# Patient Record
Sex: Male | Born: 1955 | Race: White | Hispanic: No | Marital: Married | Smoking: Current every day smoker
Health system: Southern US, Community
[De-identification: ages and names within clinical notes are randomized; demographics above are authoritative.]

## PROBLEM LIST (undated history)

## (undated) DIAGNOSIS — F419 Anxiety disorder, unspecified: Secondary | ICD-10-CM

## (undated) DIAGNOSIS — F329 Major depressive disorder, single episode, unspecified: Secondary | ICD-10-CM

## (undated) DIAGNOSIS — F32A Depression, unspecified: Secondary | ICD-10-CM

## (undated) DIAGNOSIS — G959 Disease of spinal cord, unspecified: Secondary | ICD-10-CM

## (undated) DIAGNOSIS — R269 Unspecified abnormalities of gait and mobility: Secondary | ICD-10-CM

## (undated) HISTORY — DX: Depression, unspecified: F32.A

## (undated) HISTORY — PX: LAMINECTOMY: SHX219

## (undated) HISTORY — PX: TONSILLECTOMY: SUR1361

## (undated) HISTORY — DX: Anxiety disorder, unspecified: F41.9

---
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
## 2011-11-20 ENCOUNTER — Ambulatory Visit: Payer: Self-pay | Admitting: Internal Medicine

## 2011-11-20 DIAGNOSIS — D229 Melanocytic nevi, unspecified: Secondary | ICD-10-CM | POA: Insufficient documentation

## 2011-11-20 DIAGNOSIS — M62838 Other muscle spasm: Secondary | ICD-10-CM | POA: Insufficient documentation

## 2011-11-20 DIAGNOSIS — R61 Generalized hyperhidrosis: Secondary | ICD-10-CM | POA: Insufficient documentation

## 2011-12-06 DIAGNOSIS — F419 Anxiety disorder, unspecified: Secondary | ICD-10-CM | POA: Insufficient documentation

## 2011-12-06 DIAGNOSIS — M62838 Other muscle spasm: Secondary | ICD-10-CM | POA: Insufficient documentation

## 2011-12-07 ENCOUNTER — Encounter: Payer: Self-pay | Admitting: Internal Medicine

## 2011-12-11 ENCOUNTER — Ambulatory Visit: Payer: Self-pay | Admitting: Internal Medicine

## 2011-12-21 DIAGNOSIS — Z720 Tobacco use: Secondary | ICD-10-CM | POA: Insufficient documentation

## 2011-12-27 ENCOUNTER — Encounter: Payer: Self-pay | Admitting: *Deleted

## 2011-12-27 ENCOUNTER — Telehealth: Payer: Self-pay | Admitting: *Deleted

## 2011-12-27 ENCOUNTER — Ambulatory Visit: Payer: Self-pay | Admitting: Oncology

## 2011-12-27 LAB — CBC CANCER CENTER
Eosinophil %: 4.3 %
HGB: 17.8 g/dL (ref 13.0–18.0)
Lymphocyte %: 24.4 %
MCH: 34.1 pg — ABNORMAL HIGH (ref 26.0–34.0)
MCHC: 34.7 g/dL (ref 32.0–36.0)
MCV: 98 fL (ref 80–100)
Monocyte #: 0.5 x10 3/mm (ref 0.2–1.0)
Monocyte %: 6.7 %
Neutrophil %: 63.6 %
Platelet: 159 x10 3/mm (ref 150–440)
RBC: 5.22 10*6/uL (ref 4.40–5.90)
RDW: 12.8 % (ref 11.5–14.5)
WBC: 7.8 x10 3/mm (ref 3.8–10.6)

## 2011-12-27 LAB — COMPREHENSIVE METABOLIC PANEL
Albumin: 4.1 g/dL (ref 3.4–5.0)
Anion Gap: 7 (ref 7–16)
BUN: 10 mg/dL (ref 7–18)
Calcium, Total: 9.3 mg/dL (ref 8.5–10.1)
Chloride: 99 mmol/L (ref 98–107)
Co2: 32 mmol/L (ref 21–32)
Creatinine: 1.1 mg/dL (ref 0.60–1.30)
EGFR (African American): >60
Osmolality: 274 (ref 275–301)
Potassium: 4.1 mmol/L (ref 3.5–5.1)
SGOT(AST): 16 U/L (ref 15–37)
Sodium: 138 mmol/L (ref 136–145)

## 2011-12-27 NOTE — Telephone Encounter (Signed)
Pt. Did not come for p.v.he does not answer phone x2 attempts and does not have message machine.Procedure cx. And letter sent to pt. to r/s.

## 2012-01-03 ENCOUNTER — Encounter: Payer: Self-pay | Admitting: Internal Medicine

## 2012-01-08 ENCOUNTER — Ambulatory Visit: Payer: Self-pay | Admitting: Oncology

## 2012-02-08 ENCOUNTER — Ambulatory Visit: Payer: Self-pay | Admitting: Oncology

## 2016-06-09 ENCOUNTER — Other Ambulatory Visit: Payer: Self-pay | Admitting: Acute Care

## 2016-06-09 DIAGNOSIS — F17210 Nicotine dependence, cigarettes, uncomplicated: Secondary | ICD-10-CM

## 2016-06-23 ENCOUNTER — Ambulatory Visit (INDEPENDENT_AMBULATORY_CARE_PROVIDER_SITE_OTHER): Payer: Managed Care, Other (non HMO) | Admitting: Acute Care

## 2016-06-23 ENCOUNTER — Encounter: Payer: Self-pay | Admitting: Acute Care

## 2016-06-23 ENCOUNTER — Inpatient Hospital Stay: Admission: RE | Admit: 2016-06-23 | Payer: Self-pay | Source: Ambulatory Visit

## 2016-06-23 DIAGNOSIS — F17210 Nicotine dependence, cigarettes, uncomplicated: Secondary | ICD-10-CM

## 2016-06-23 NOTE — Progress Notes (Signed)
Shared Decision Making Visit Lung Cancer Screening Program [phone]([id number])   Eligibility:  Age 60 y.o.  Pack Years Smoking History Calculation 35 (# packs/per year x # years smoked)  Recent History of coughing up blood  no  Unexplained weight loss? no ( >Than 15 pounds within the last 6 months )  Prior History Lung / other cancer no (Diagnosis within the last 5 years already requiring surveillance chest CT Scans).  Smoking Status Current Smoker  Former Smokers: Years since quit:NA  Quit Date: NA  Visit Components:  Discussion included one or more decision making aids. yes  Discussion included risk/benefits of screening. yes  Discussion included potential follow up diagnostic testing for abnormal scans. yes  Discussion included meaning and risk of over diagnosis. yes  Discussion included meaning and risk of False Positives. yes  Discussion included meaning of total radiation exposure. yes  Counseling Included:  Importance of adherence to annual lung cancer LDCT screening. yes  Impact of comorbidities on ability to participate in the program. yes  Ability and willingness to under diagnostic treatment. yes  Smoking Cessation Counseling:  Current Smokers:   Discussed importance of smoking cessation. yes  Information about tobacco cessation classes and interventions provided to patient. yes  Patient provided with "ticket" for LDCT Scan. yes  Symptomatic Patient. no  Counseling  Diagnosis Code: Tobacco Use Z72.0  Asymptomatic Patient yes  Counseling (Intermediate counseling: > three minutes counseling) [id number]  Former Smokers:   Discussed the importance of maintaining cigarette abstinence. yes  Diagnosis Code: Personal History of Nicotine Dependence. X91.478Z87.891  Information about tobacco cessation classes and interventions provided to patient. Yes  Patient provided with "ticket" for LDCT Scan. yes  Written Order for Lung Cancer Screening with LDCT placed in  Epic. Yes (CT Chest Lung Cancer Screening Low Dose W/O CM) [id number] Z12.2-Screening of respiratory organs Z87.891-Personal history of nicotine dependence   I have spent 20  minutes of face to face time with Brandon Heath discussing the risks and benefits of lung cancer screening. We viewed a power point together that explained in detail the above noted topics. We paused at intervals to allow for questions to be asked and answered to ensure understanding.We discussed that the single most powerful action that he can take to decrease his risk of developing lung cancer is to quit smoking. We discussed whether or not he is ready to commit to setting a quit date. We discussed options for tools to aid in quitting smoking including nicotine replacement therapy, non-nicotine medications, support groups, Quit Smart classes, and behavior modification. We discussed that often times setting smaller, more achievable goals, such as eliminating 1 cigarette a day for a week and then 2 cigarettes a day for a week can be helpful in slowly decreasing the number of cigarettes smoked. ( Reducing to quit).This allows for a sense of accomplishment as well as providing a clinical benefit. I gave Brandon Heath  the " Be Stronger Than Your Excuses" card with contact information for community resources, classes, free nicotine replacement therapy, and access to mobile apps, text messaging, and on-line smoking cessation help. I have also given him  my card and contact information in the event he needs to contact me. We discussed the time and location of the scan, and that either Brandon Heath, CMA, or I will call with the results within 24-48 hours of receiving them. I have provided him  with a copy of the power point we viewed  as a resource in the event  they need reinforcement of the concepts we discussed today in the office. The patient verbalized understanding of all of  the above and had no further questions upon leaving the office. They have my  contact information in the event they have any further questions.  Bevelyn NgoSarah F Rondrick Barreira, NP 06/23/2016

## 2016-06-30 ENCOUNTER — Telehealth: Payer: Self-pay | Admitting: Acute Care

## 2016-06-30 ENCOUNTER — Ambulatory Visit (INDEPENDENT_AMBULATORY_CARE_PROVIDER_SITE_OTHER)
Admission: RE | Admit: 2016-06-30 | Discharge: 2016-06-30 | Disposition: A | Discharge status: Home / Self Care | Payer: Managed Care, Other (non HMO) | Source: Ambulatory Visit | Attending: Acute Care | Admitting: Acute Care

## 2016-06-30 DIAGNOSIS — F17210 Nicotine dependence, cigarettes, uncomplicated: Secondary | ICD-10-CM

## 2016-06-30 DIAGNOSIS — Z87891 Personal history of nicotine dependence: Secondary | ICD-10-CM

## 2016-06-30 NOTE — Telephone Encounter (Signed)
I have called Brandon Heath with the results of his low-dose screening CT. I explained that his scan was read as a lung RADS 1, indicating no nodules, or a negative study. Recommendation per radiology is for a repeat annual screening with low-dose chest CT in December 2018 which we will order and schedule in December 2018. He verbalized understanding of the above and had no further questions at completion of the call. I explained that we would fax a copy of this report to his primary care physician.

## 2017-07-18 ENCOUNTER — Encounter: Payer: Self-pay | Admitting: Acute Care

## 2018-04-19 ENCOUNTER — Other Ambulatory Visit: Payer: Self-pay | Admitting: Acute Care

## 2018-04-19 DIAGNOSIS — Z720 Tobacco use: Secondary | ICD-10-CM

## 2018-04-19 NOTE — Progress Notes (Signed)
Error

## 2018-06-05 ENCOUNTER — Ambulatory Visit (INDEPENDENT_AMBULATORY_CARE_PROVIDER_SITE_OTHER)
Admission: RE | Admit: 2018-06-05 | Discharge: 2018-06-05 | Disposition: A | Discharge status: Home / Self Care | Payer: Managed Care, Other (non HMO) | Source: Ambulatory Visit | Attending: Acute Care | Admitting: Acute Care

## 2018-06-05 DIAGNOSIS — Z720 Tobacco use: Secondary | ICD-10-CM

## 2018-06-24 ENCOUNTER — Encounter: Payer: Self-pay | Admitting: *Deleted

## 2018-06-24 ENCOUNTER — Other Ambulatory Visit: Payer: Self-pay | Admitting: Acute Care

## 2018-06-24 DIAGNOSIS — F17210 Nicotine dependence, cigarettes, uncomplicated: Principal | ICD-10-CM

## 2018-06-24 DIAGNOSIS — Z122 Encounter for screening for malignant neoplasm of respiratory organs: Secondary | ICD-10-CM

## 2019-02-26 DIAGNOSIS — Z125 Encounter for screening for malignant neoplasm of prostate: Secondary | ICD-10-CM | POA: Diagnosis not present

## 2019-02-26 DIAGNOSIS — Z0000 Encounter for general adult medical examination without abnormal findings: Secondary | ICD-10-CM | POA: Diagnosis not present

## 2019-03-04 DIAGNOSIS — Z0000 Encounter for general adult medical examination without abnormal findings: Secondary | ICD-10-CM | POA: Diagnosis not present

## 2019-03-04 DIAGNOSIS — Z23 Encounter for immunization: Secondary | ICD-10-CM | POA: Diagnosis not present

## 2019-03-04 DIAGNOSIS — E7800 Pure hypercholesterolemia, unspecified: Secondary | ICD-10-CM | POA: Diagnosis not present

## 2019-03-10 DIAGNOSIS — R399 Unspecified symptoms and signs involving the genitourinary system: Secondary | ICD-10-CM | POA: Diagnosis not present

## 2019-03-10 DIAGNOSIS — N390 Urinary tract infection, site not specified: Secondary | ICD-10-CM | POA: Diagnosis not present

## 2019-03-10 DIAGNOSIS — R319 Hematuria, unspecified: Secondary | ICD-10-CM | POA: Diagnosis not present

## 2019-04-08 DIAGNOSIS — Z1211 Encounter for screening for malignant neoplasm of colon: Secondary | ICD-10-CM | POA: Diagnosis not present

## 2019-04-08 DIAGNOSIS — Z86010 Personal history of colonic polyps: Secondary | ICD-10-CM | POA: Diagnosis not present

## 2019-04-09 DIAGNOSIS — D225 Melanocytic nevi of trunk: Secondary | ICD-10-CM | POA: Diagnosis not present

## 2019-04-09 DIAGNOSIS — D485 Neoplasm of uncertain behavior of skin: Secondary | ICD-10-CM | POA: Diagnosis not present

## 2019-04-09 DIAGNOSIS — L905 Scar conditions and fibrosis of skin: Secondary | ICD-10-CM | POA: Diagnosis not present

## 2019-04-16 ENCOUNTER — Encounter: Payer: Self-pay | Admitting: Neurology

## 2019-04-16 ENCOUNTER — Other Ambulatory Visit: Payer: Self-pay

## 2019-04-16 ENCOUNTER — Ambulatory Visit (INDEPENDENT_AMBULATORY_CARE_PROVIDER_SITE_OTHER): Payer: BC Managed Care – PPO | Admitting: Neurology

## 2019-04-16 DIAGNOSIS — G959 Disease of spinal cord, unspecified: Secondary | ICD-10-CM

## 2019-04-16 DIAGNOSIS — R269 Unspecified abnormalities of gait and mobility: Secondary | ICD-10-CM | POA: Diagnosis not present

## 2019-04-16 HISTORY — DX: Disease of spinal cord, unspecified: G95.9

## 2019-04-16 HISTORY — DX: Unspecified abnormalities of gait and mobility: R26.9

## 2019-04-16 MED ORDER — TIZANIDINE HCL 2 MG PO TABS: 2.0000 mg | ORAL_TABLET | Freq: Three times a day (TID) | ORAL | 3 refills | Status: DC

## 2019-04-16 NOTE — Progress Notes (Signed)
Reason for visit: Gait disorder  Referring physician: Dr. [name] is a 63 y.o. male  History of present illness:  Brandon Heath is a 63 year old right-handed white male with a history of a cervical myelopathy that was discovered 25 or 30 years ago requiring a cervical laminectomy.  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  He believes that there may have been some progression in his ability to ambulate over the last several years, he has not had recent falls but he is having increasing problems with catching his toe when he walks.  He feels more weak in the lower extremities and has some tightness in the hamstrings.  He denies any issues with the arms, but he does have some numbness in the hands bilaterally.  He denies any muscle cramps or difficulty controlling the bowels or the bladder.  He has been placed on baclofen in the past but he stopped the medication only after a few days, he does not recall why.  He is sent back to this office for an evaluation.  Past Medical History:  Diagnosis Date  . Anxiety   . Cervical myelopathy (HCC) 04/16/2019  . Depression   . Gait disorder 04/16/2019    Past Surgical History:  Procedure Laterality Date  . LAMINECTOMY     cervical   . TONSILLECTOMY      Family History  Problem Relation Age of Onset  . Heart disease Mother   . Rectal cancer Father     Social history:  reports that he has been smoking cigarettes. He has a 35.00 pack-year smoking history. He has never used smokeless tobacco. No history on file for alcohol and drug.  Medications:  Prior to Admission medications   Medication Sig Start Date End Date Taking? Authorizing Provider  ALPRAZolam Prudy Feeler) 0.25 MG tablet  04/09/19  Yes [provider]  EUTHYROX 25 MCG tablet  04/09/19  Yes [provider]     No Known Allergies  ROS:  Out of a complete 14 system  review of symptoms, the patient complains only of the following symptoms, and all other reviewed systems are negative.  Aching muscles Restless legs  Blood pressure 106/74, pulse 88, temperature 98 F (36.7 C), temperature source Temporal, height 5\' 7"  (1.702 m), weight 134 lb 5 oz (60.9 kg), SpO2 97 %.  Physical Exam  General: The patient is alert and cooperative at the time of the examination.  Eyes: Pupils are equal, round, and reactive to light. Discs are flat bilaterally.  Neck: The neck is supple, no carotid bruits are noted.  Respiratory: The respiratory examination is clear.  Cardiovascular: The cardiovascular examination reveals a regular rate and rhythm, no obvious murmurs or rubs are noted.  Skin: Extremities are without significant edema.  Neurologic Exam  Mental status: The patient is alert and oriented x 3 at the time of the examination. The patient has apparent normal recent and remote memory, with an apparently normal attention span and concentration ability.  Cranial nerves: Facial symmetry is present. There is good sensation of the face to pinprick and soft touch bilaterally. The strength of the facial muscles and the muscles to head turning and shoulder shrug are normal bilaterally. Speech is well enunciated, no aphasia or dysarthria is noted. Extraocular movements are full. Visual fields are full. The tongue is midline, and the patient has symmetric elevation of the soft  palate. No obvious hearing deficits are noted.  Motor: The motor testing reveals 5 over 5 strength of all 4 extremities, with exception of 4/5 strength with hip flexion bilaterally, left weaker than the right. Good symmetric motor tone is noted throughout.  Sensory: Sensory testing is intact to pinprick, soft touch, vibration sensation, and position sense on all 4 extremities. No evidence of extinction is noted.  Coordination: Cerebellar testing reveals mild dysmetria with finger-nose-finger  bilaterally, left greater than right, and some dysmetria with heel-to-shin bilaterally, left greater than right.  Gait and station: Gait is slightly unsteady, slightly wide-based, mildly diplegic.  Tandem gait is unsteady.  Romberg is negative.  Reflexes: Deep tendon reflexes are symmetric and are brisk bilaterally. Toes are upgoing on the left, neutral on the right.  Sustained ankle clonus is seen bilaterally.   Assessment/Plan:  1.  Cervical myelopathy  2.  Gait disorder  Theoretically, this patient should benefit from the use of medications for spasticity.  We will start tizanidine 2 mg 3 times daily.  If he believes that his ability to walk is worsening over time, he is to contact our office and we will check MRI of the cervical spine.  I have indicated that muscle toning exercises may be of some benefit, he does have some proximal weakness in both legs.  He will follow-up in about 6 months.  Jill Alexanders MD 04/16/2019 6:56 PM  [facility] [address] [place], [place] [address]  Phone [phone] Fax [phone]

## 2019-04-30 DIAGNOSIS — K6389 Other specified diseases of intestine: Secondary | ICD-10-CM | POA: Diagnosis not present

## 2019-04-30 DIAGNOSIS — K5730 Diverticulosis of large intestine without perforation or abscess without bleeding: Secondary | ICD-10-CM | POA: Diagnosis not present

## 2019-04-30 DIAGNOSIS — Z1211 Encounter for screening for malignant neoplasm of colon: Secondary | ICD-10-CM | POA: Diagnosis not present

## 2019-04-30 DIAGNOSIS — D127 Benign neoplasm of rectosigmoid junction: Secondary | ICD-10-CM | POA: Diagnosis not present

## 2019-04-30 DIAGNOSIS — D122 Benign neoplasm of ascending colon: Secondary | ICD-10-CM | POA: Diagnosis not present

## 2019-04-30 DIAGNOSIS — K635 Polyp of colon: Secondary | ICD-10-CM | POA: Diagnosis not present

## 2019-06-09 ENCOUNTER — Other Ambulatory Visit: Payer: Self-pay

## 2019-06-09 ENCOUNTER — Ambulatory Visit (INDEPENDENT_AMBULATORY_CARE_PROVIDER_SITE_OTHER)
Admission: RE | Admit: 2019-06-09 | Discharge: 2019-06-09 | Disposition: A | Discharge status: Home / Self Care | Payer: BC Managed Care – PPO | Source: Ambulatory Visit | Attending: Acute Care | Admitting: Acute Care

## 2019-06-09 DIAGNOSIS — Z87891 Personal history of nicotine dependence: Secondary | ICD-10-CM

## 2019-06-09 DIAGNOSIS — Z122 Encounter for screening for malignant neoplasm of respiratory organs: Secondary | ICD-10-CM

## 2019-06-09 DIAGNOSIS — F17210 Nicotine dependence, cigarettes, uncomplicated: Secondary | ICD-10-CM

## 2019-06-16 ENCOUNTER — Encounter: Payer: Self-pay | Admitting: *Deleted

## 2019-06-16 ENCOUNTER — Other Ambulatory Visit: Payer: Self-pay | Admitting: *Deleted

## 2019-06-16 DIAGNOSIS — F17210 Nicotine dependence, cigarettes, uncomplicated: Secondary | ICD-10-CM

## 2019-06-16 DIAGNOSIS — Z122 Encounter for screening for malignant neoplasm of respiratory organs: Secondary | ICD-10-CM

## 2019-10-20 ENCOUNTER — Ambulatory Visit: Payer: BC Managed Care – PPO | Admitting: Neurology

## 2019-10-31 DIAGNOSIS — R05 Cough: Secondary | ICD-10-CM | POA: Diagnosis not present

## 2019-10-31 DIAGNOSIS — R0981 Nasal congestion: Secondary | ICD-10-CM | POA: Diagnosis not present

## 2019-10-31 DIAGNOSIS — Z20828 Contact with and (suspected) exposure to other viral communicable diseases: Secondary | ICD-10-CM | POA: Diagnosis not present

## 2020-03-03 DIAGNOSIS — Z125 Encounter for screening for malignant neoplasm of prostate: Secondary | ICD-10-CM | POA: Diagnosis not present

## 2020-03-03 DIAGNOSIS — Z0000 Encounter for general adult medical examination without abnormal findings: Secondary | ICD-10-CM | POA: Diagnosis not present

## 2020-03-03 DIAGNOSIS — E7800 Pure hypercholesterolemia, unspecified: Secondary | ICD-10-CM | POA: Diagnosis not present

## 2020-05-12 DIAGNOSIS — R972 Elevated prostate specific antigen [PSA]: Secondary | ICD-10-CM | POA: Diagnosis not present

## 2020-05-12 DIAGNOSIS — N400 Enlarged prostate without lower urinary tract symptoms: Secondary | ICD-10-CM | POA: Diagnosis not present

## 2020-06-09 ENCOUNTER — Other Ambulatory Visit: Payer: Self-pay

## 2020-06-09 ENCOUNTER — Ambulatory Visit (INDEPENDENT_AMBULATORY_CARE_PROVIDER_SITE_OTHER)
Admission: RE | Admit: 2020-06-09 | Discharge: 2020-06-09 | Disposition: A | Discharge status: Home / Self Care | Payer: BC Managed Care – PPO | Source: Ambulatory Visit | Attending: Internal Medicine | Admitting: Internal Medicine

## 2020-06-09 DIAGNOSIS — Z87891 Personal history of nicotine dependence: Secondary | ICD-10-CM | POA: Diagnosis not present

## 2020-06-09 DIAGNOSIS — F17210 Nicotine dependence, cigarettes, uncomplicated: Secondary | ICD-10-CM

## 2020-06-09 DIAGNOSIS — Z122 Encounter for screening for malignant neoplasm of respiratory organs: Secondary | ICD-10-CM

## 2020-06-14 NOTE — Progress Notes (Signed)
Please call patient and let them  know their  low dose Ct was read as a Lung RADS 1, negative study: no nodules or definitely benign nodules. Radiology recommendation is for a repeat LDCT in 12 months. Please let them  know we will order and schedule their  annual screening scan for 06/2021. Please let them  know there was notation of CAD on their  scan.  Please remind the patient  that this is a non-gated exam therefore degree or severity of disease  cannot be determined. Please have them  follow up with their PCP regarding potential risk factor modification, dietary therapy or pharmacologic therapy if clinically indicated. Pt.  is  currently on statin therapy. Please place order for annual  screening scan for  06/2021 and fax results to PCP. Thanks so much. 

## 2020-06-17 ENCOUNTER — Other Ambulatory Visit: Payer: Self-pay | Admitting: *Deleted

## 2020-06-17 DIAGNOSIS — F17210 Nicotine dependence, cigarettes, uncomplicated: Secondary | ICD-10-CM

## 2020-10-26 DIAGNOSIS — M25561 Pain in right knee: Secondary | ICD-10-CM | POA: Diagnosis not present

## 2020-10-26 DIAGNOSIS — M25552 Pain in left hip: Secondary | ICD-10-CM | POA: Diagnosis not present

## 2020-10-26 DIAGNOSIS — M25551 Pain in right hip: Secondary | ICD-10-CM | POA: Diagnosis not present

## 2020-10-26 DIAGNOSIS — M5459 Other low back pain: Secondary | ICD-10-CM | POA: Diagnosis not present

## 2020-11-02 DIAGNOSIS — M5459 Other low back pain: Secondary | ICD-10-CM | POA: Diagnosis not present

## 2020-11-02 DIAGNOSIS — M25551 Pain in right hip: Secondary | ICD-10-CM | POA: Diagnosis not present

## 2020-11-02 DIAGNOSIS — M25552 Pain in left hip: Secondary | ICD-10-CM | POA: Diagnosis not present

## 2020-11-02 DIAGNOSIS — M25561 Pain in right knee: Secondary | ICD-10-CM | POA: Diagnosis not present

## 2021-01-11 ENCOUNTER — Telehealth: Payer: Self-pay | Admitting: Neurology

## 2021-01-11 DIAGNOSIS — G959 Disease of spinal cord, unspecified: Secondary | ICD-10-CM

## 2021-01-11 NOTE — Telephone Encounter (Signed)
Pt called wanting to know if an MRI can be ordered for him due to him not feeling any better. Please advise.

## 2021-01-11 NOTE — Telephone Encounter (Signed)
I called the patient.  He believes that his walking problem has worsened gradually over time, he does have a history of a cervical myelopathy.  I will check MRI of the cervical spine.

## 2021-01-11 NOTE — Addendum Note (Signed)
Addended by: York Spaniel on: 01/11/2021 05:47 PM   Modules accepted: Orders

## 2021-01-12 ENCOUNTER — Telehealth: Payer: Self-pay | Admitting: Neurology

## 2021-01-12 NOTE — Telephone Encounter (Signed)
Unable to LVM to schedule MRI.

## 2021-01-12 NOTE — Telephone Encounter (Addendum)
MRI cervical spine wo contrast [employer] auth: [id number] (01/12/21-02/10/21)  Scheduled at [facility] 01/18/21 at 2:00 pm.

## 2021-01-18 ENCOUNTER — Ambulatory Visit: Payer: BC Managed Care – PPO

## 2021-01-18 DIAGNOSIS — G959 Disease of spinal cord, unspecified: Secondary | ICD-10-CM

## 2021-01-20 ENCOUNTER — Telehealth: Payer: Self-pay | Admitting: Neurology

## 2021-01-20 NOTE — Telephone Encounter (Signed)
I called the patient.  He did not answer the telephone.  I will call back later.  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   MRI cervical 01/19/21:  IMPRESSION:   MRI cervical spine without contrast demonstrating: - Posterior decompression from C2-3 down to C5-6 level.  Myelomalacia within spinal cord at C3-4 level. - At C3-4 uncovertebral joint hypertrophy with severe bilateral foraminal stenosis; myelomalacia and posterior decompression noted. - At C4-5 uncovertebral joint and facet hypertrophy with severe bilateral foraminal stenosis; posterior decompression noted. - At C7-T1 uncovertebral joint hypertrophy with moderate right and severe left foraminal stenosis. - At T1-2 disc bulging facet hypertrophy with moderate bilateral foraminal stenosis.

## 2021-01-20 NOTE — Telephone Encounter (Signed)
I called the patient again, unable to leave a message.  I will call back again tomorrow.

## 2021-01-21 NOTE — Telephone Encounter (Signed)
I called again, unable to leave message.  I sent the patient a message through MyChart.

## 2021-07-05 ENCOUNTER — Other Ambulatory Visit: Payer: Self-pay | Admitting: Acute Care

## 2021-07-05 DIAGNOSIS — F17210 Nicotine dependence, cigarettes, uncomplicated: Secondary | ICD-10-CM

## 2021-08-10 ENCOUNTER — Telehealth: Payer: Self-pay | Admitting: Emergency Medicine

## 2021-08-11 NOTE — Telephone Encounter (Signed)
I'm not sure why [place] from [employer] called I already new it was approved

## 2021-08-16 ENCOUNTER — Inpatient Hospital Stay: Admission: RE | Admit: 2021-08-16 | Payer: BC Managed Care – PPO | Source: Ambulatory Visit

## 2021-08-17 ENCOUNTER — Other Ambulatory Visit: Payer: Self-pay

## 2021-08-17 ENCOUNTER — Ambulatory Visit (INDEPENDENT_AMBULATORY_CARE_PROVIDER_SITE_OTHER)
Admission: RE | Admit: 2021-08-17 | Discharge: 2021-08-17 | Disposition: A | Discharge status: Home / Self Care | Payer: BC Managed Care – PPO | Source: Ambulatory Visit | Attending: Acute Care | Admitting: Acute Care

## 2021-08-17 DIAGNOSIS — F17210 Nicotine dependence, cigarettes, uncomplicated: Secondary | ICD-10-CM | POA: Diagnosis not present

## 2021-08-22 ENCOUNTER — Other Ambulatory Visit: Payer: Self-pay

## 2021-08-22 DIAGNOSIS — Z87891 Personal history of nicotine dependence: Secondary | ICD-10-CM

## 2021-08-22 DIAGNOSIS — F17210 Nicotine dependence, cigarettes, uncomplicated: Secondary | ICD-10-CM

## 2021-12-07 DIAGNOSIS — E7800 Pure hypercholesterolemia, unspecified: Secondary | ICD-10-CM | POA: Diagnosis not present

## 2021-12-07 DIAGNOSIS — R399 Unspecified symptoms and signs involving the genitourinary system: Secondary | ICD-10-CM | POA: Diagnosis not present

## 2021-12-07 DIAGNOSIS — Z0000 Encounter for general adult medical examination without abnormal findings: Secondary | ICD-10-CM | POA: Diagnosis not present

## 2021-12-07 DIAGNOSIS — Z125 Encounter for screening for malignant neoplasm of prostate: Secondary | ICD-10-CM | POA: Diagnosis not present

## 2021-12-13 DIAGNOSIS — E039 Hypothyroidism, unspecified: Secondary | ICD-10-CM | POA: Diagnosis not present

## 2021-12-13 DIAGNOSIS — I700 Atherosclerosis of aorta: Secondary | ICD-10-CM | POA: Diagnosis not present

## 2021-12-13 DIAGNOSIS — Z23 Encounter for immunization: Secondary | ICD-10-CM | POA: Diagnosis not present

## 2021-12-13 DIAGNOSIS — Z0000 Encounter for general adult medical examination without abnormal findings: Secondary | ICD-10-CM | POA: Diagnosis not present

## 2021-12-13 DIAGNOSIS — E7800 Pure hypercholesterolemia, unspecified: Secondary | ICD-10-CM | POA: Diagnosis not present

## 2022-03-29 ENCOUNTER — Other Ambulatory Visit: Payer: Self-pay | Admitting: Registered Nurse

## 2022-03-29 ENCOUNTER — Other Ambulatory Visit (HOSPITAL_BASED_OUTPATIENT_CLINIC_OR_DEPARTMENT_OTHER): Payer: Self-pay | Admitting: Registered Nurse

## 2022-03-29 DIAGNOSIS — I700 Atherosclerosis of aorta: Secondary | ICD-10-CM

## 2022-05-07 IMAGING — CT CT CHEST LUNG CANCER SCREENING LOW DOSE W/O CM
2 of 4 series · 15 of 36 positions shown, 18 images · non-contrast
Comparison: Low-dose lung cancer screening chest CT 06/09/2020.

CLINICAL DATA: 65-year-old male current smoker with 39 pack-year
history of smoking. Lung cancer screening examination.



[Series 3: lung thins 1.0 · axial · 0.72mm/px · z∈[-392,-42]mm · 12 of 387 slices shown, 15 images]
[im 18/387  mediastinal]
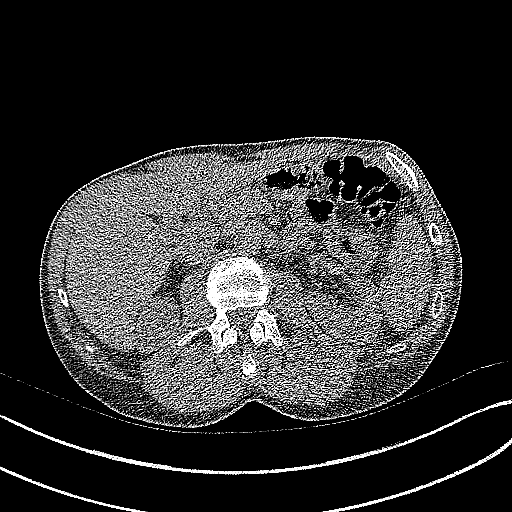
[im 18/387  lung]
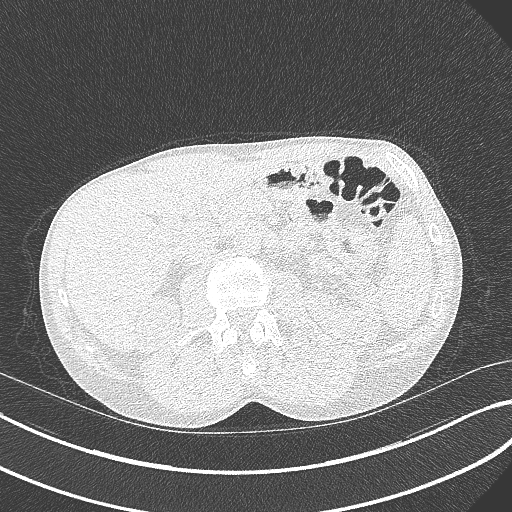
[im 53/387  lung]
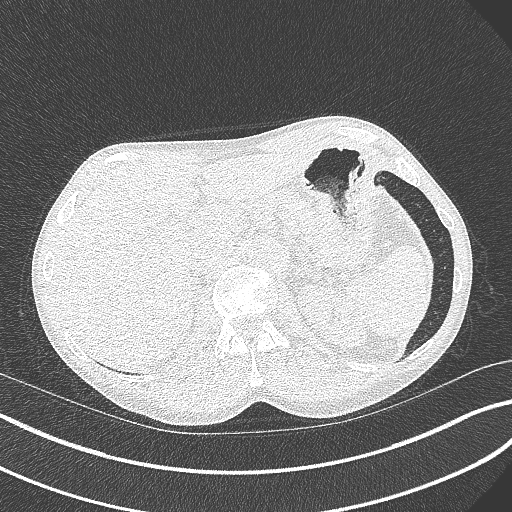
[im 88/387  lung]
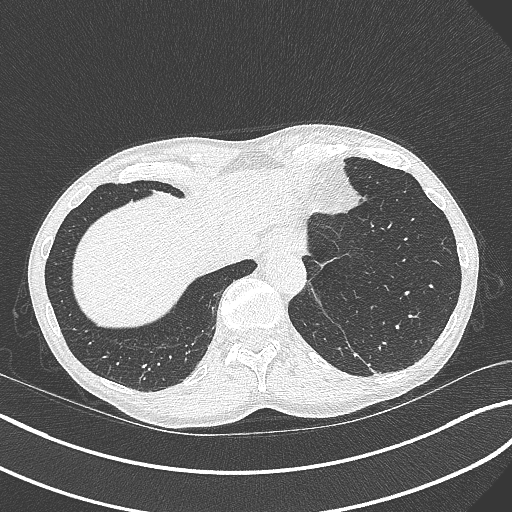
[im 123/387  lung]
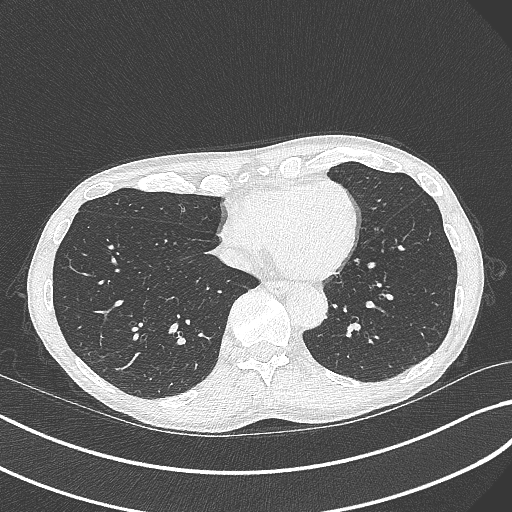
[im 141/387  mediastinal]
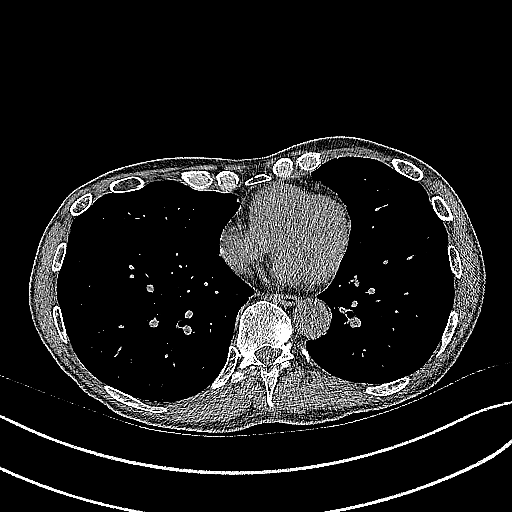
[im 141/387  lung]
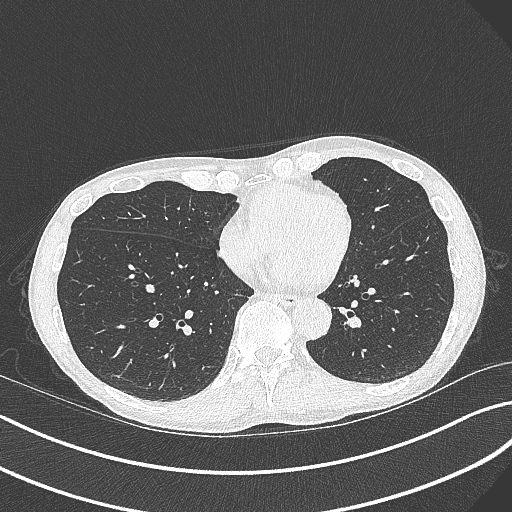
[im 176/387  lung]
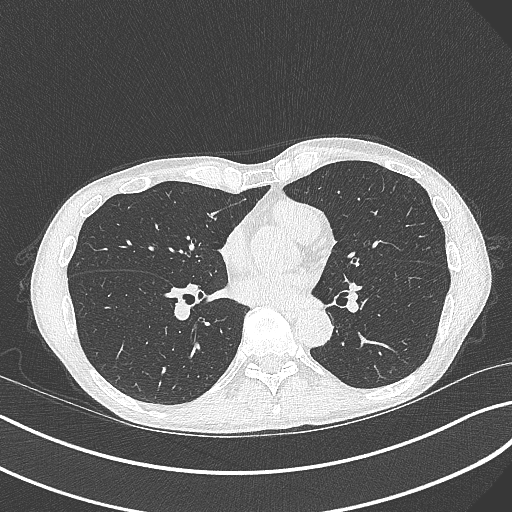
[im 211/387  lung]
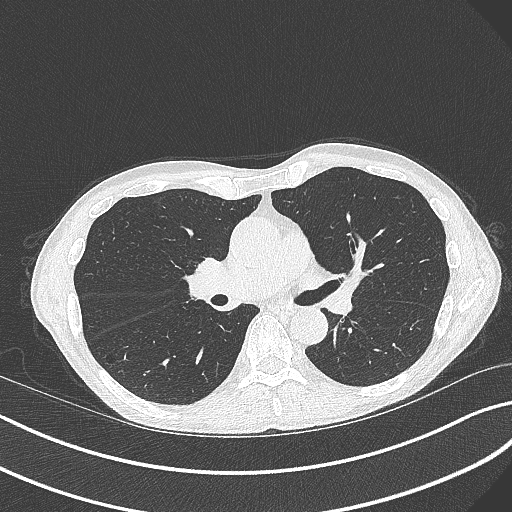
[im 246/387  lung]
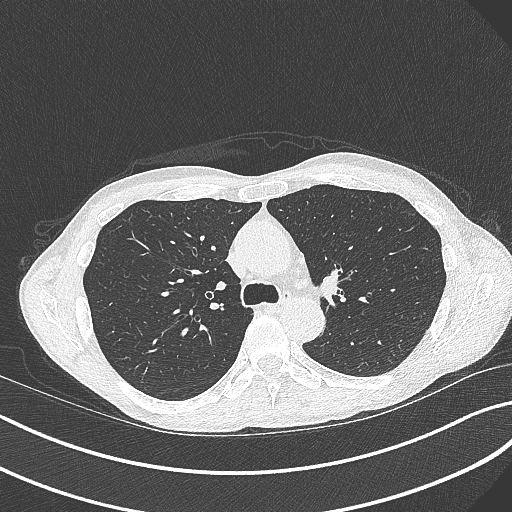
[im 264/387  mediastinal]
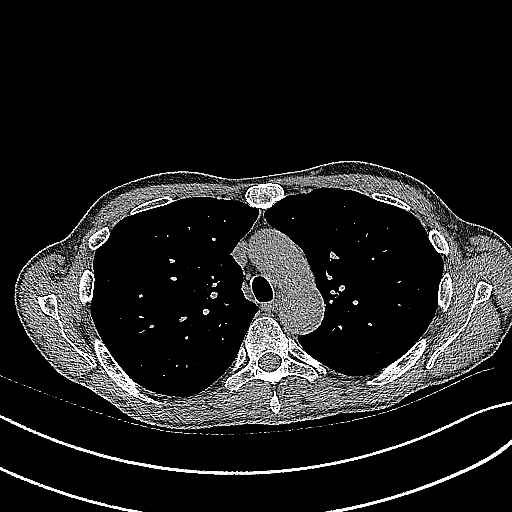
[im 264/387  lung]
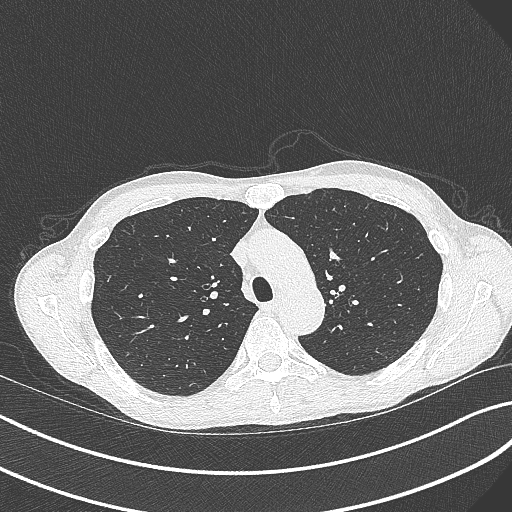
[im 299/387  lung]
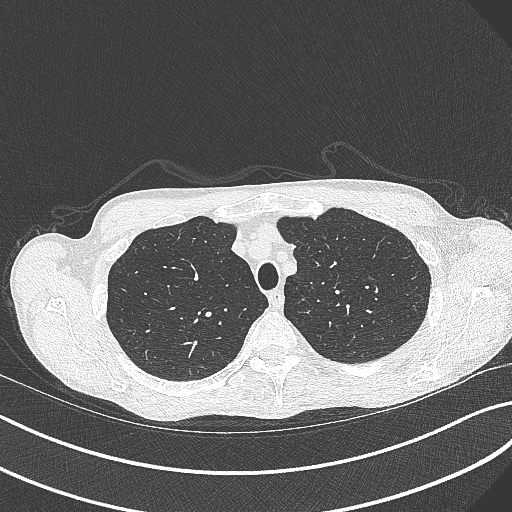
[im 334/387  lung]
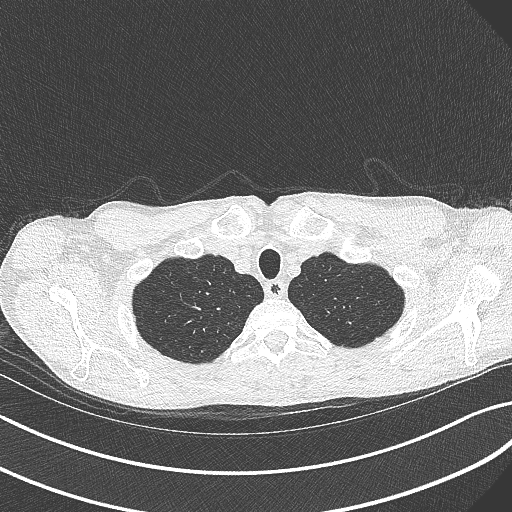
[im 369/387  lung]
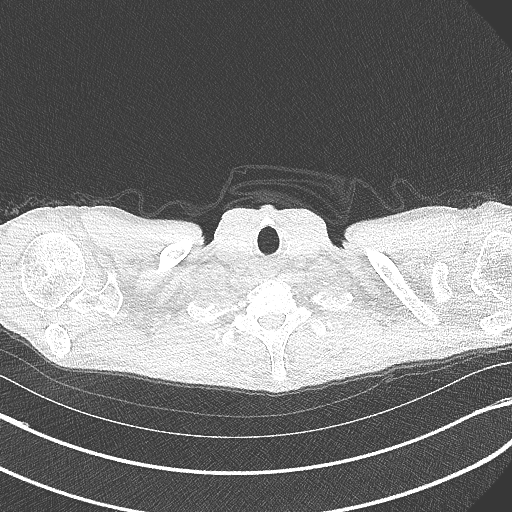

[Series 5: coronal · coronal · 0.66mm/px · 3 of 106 slices shown]
[im 22/106  lung]
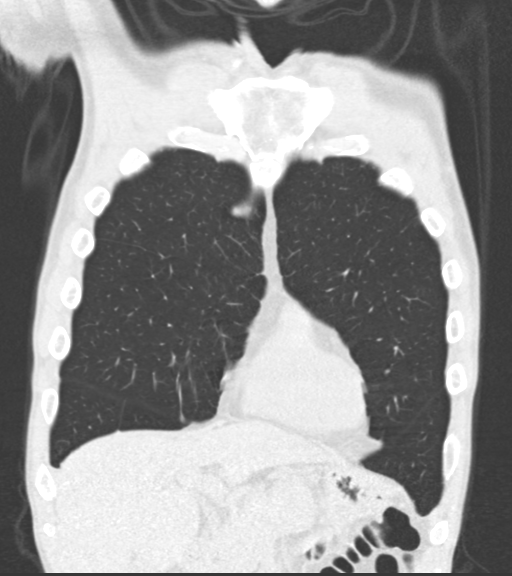
[im 43/106  lung]
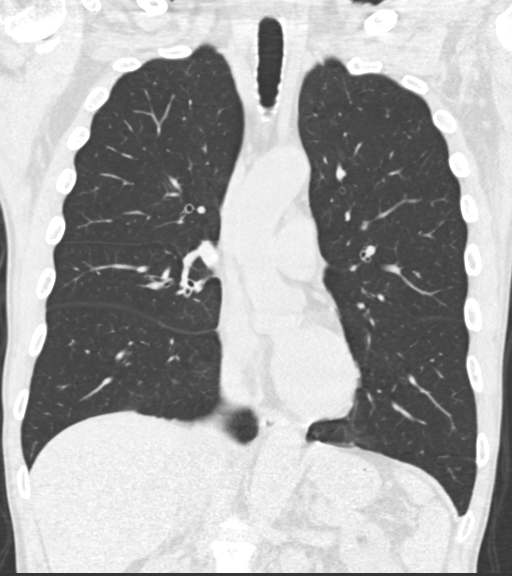
[im 64/106  lung]
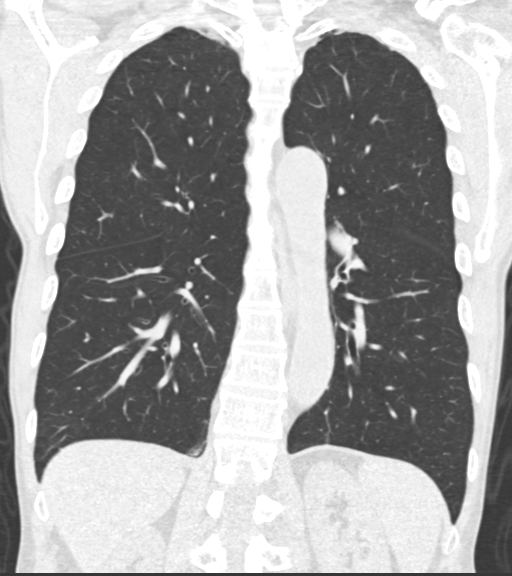

[15 of 36 positions shown; findings below may reference images not displayed]

FINDINGS: Cardiovascular: Heart size is normal. There is no significant
pericardial fluid, thickening or pericardial calcification. No
atherosclerotic calcifications are noted in the thoracic aorta or
the coronary arteries.

Mediastinum/Nodes: No pathologically enlarged mediastinal or hilar
lymph nodes. Please note that accurate exclusion of hilar adenopathy
is limited on noncontrast CT scans. Esophagus is unremarkable in
appearance. No axillary lymphadenopathy.

Lungs/Pleura: Tiny pulmonary nodule in the right upper lobe (axial
image 113 of series 3), with a volume derived mean diameter of only
1.1 mm. No larger more suspicious appearing pulmonary nodules or
masses are noted. No acute consolidative airspace disease. No
pleural effusions. Mild diffuse bronchial wall thickening with mild
centrilobular and paraseptal emphysema.

Upper Abdomen: Aortic atherosclerosis.

Musculoskeletal: There are no aggressive appearing lytic or blastic
lesions noted in the visualized portions of the skeleton.
IMPRESSION: 1. Lung-RADS 2, benign appearance or behavior. Continue annual
screening with low-dose chest CT without contrast in 12 months.
2. Aortic atherosclerosis.
3. Mild diffuse bronchial wall thickening with mild centrilobular
and paraseptal emphysema; imaging findings suggestive of underlying
COPD.

Aortic Atherosclerosis (0FZR6-LOM.M) and Emphysema (0FZR6-[facility].B).

## 2022-06-20 ENCOUNTER — Other Ambulatory Visit (HOSPITAL_COMMUNITY): Payer: BC Managed Care – PPO

## 2022-06-20 ENCOUNTER — Ambulatory Visit (HOSPITAL_COMMUNITY): Payer: BC Managed Care – PPO

## 2022-06-30 ENCOUNTER — Ambulatory Visit (HOSPITAL_COMMUNITY)
Admission: RE | Admit: 2022-06-30 | Discharge: 2022-06-30 | Disposition: A | Discharge status: Home / Self Care | Payer: BC Managed Care – PPO | Source: Ambulatory Visit | Attending: Registered Nurse | Admitting: Registered Nurse

## 2022-06-30 DIAGNOSIS — I700 Atherosclerosis of aorta: Secondary | ICD-10-CM | POA: Insufficient documentation

## 2022-07-05 DIAGNOSIS — N400 Enlarged prostate without lower urinary tract symptoms: Secondary | ICD-10-CM | POA: Diagnosis not present

## 2022-07-05 DIAGNOSIS — R972 Elevated prostate specific antigen [PSA]: Secondary | ICD-10-CM | POA: Diagnosis not present

## 2022-07-18 ENCOUNTER — Encounter: Payer: Self-pay | Admitting: Emergency Medicine

## 2022-07-18 ENCOUNTER — Other Ambulatory Visit: Payer: Self-pay

## 2022-07-18 ENCOUNTER — Ambulatory Visit
Admission: EM | Admit: 2022-07-18 | Discharge: 2022-07-18 | Disposition: A | Discharge status: Home / Self Care | Payer: BC Managed Care – PPO | Attending: Emergency Medicine | Admitting: Emergency Medicine

## 2022-07-18 DIAGNOSIS — Z1152 Encounter for screening for COVID-19: Secondary | ICD-10-CM | POA: Diagnosis not present

## 2022-07-18 NOTE — ED Provider Notes (Addendum)
[facility]    CSN: [id number] Arrival date & time: 07/18/22  1453    HISTORY   Chief Complaint  Patient presents with   Cough   Nasal Congestion   HPI Brandon Heath is a pleasant, 67 y.o. male who presents to urgent care today. Patient complains of cough, chills, dizziness and bodyaches for the past 2 days.  Patient is requesting testing for COVID-19.  Patient has normal vital signs on arrival today.  Patient denies known sick contacts.  The history is provided by the patient.   Past Medical History:  Diagnosis Date   Anxiety    Cervical myelopathy (HCC) 04/16/2019   Depression    Gait disorder 04/16/2019   Patient Active Problem List   Diagnosis Date Noted   Cervical myelopathy (HCC) 04/16/2019   Gait disorder 04/16/2019   Past Surgical History:  Procedure Laterality Date   LAMINECTOMY     cervical    TONSILLECTOMY      Home Medications    Prior to Admission medications   Medication Sig Start Date End Date Taking? Authorizing Provider  ALPRAZolam Prudy Feeler) 0.25 MG tablet  04/09/19   [provider]  EUTHYROX 25 MCG tablet  04/09/19   [provider]  tiZANidine (ZANAFLEX) 2 MG tablet Take 1 tablet (2 mg total) by mouth 3 (three) times daily. 04/16/19   York Spaniel, MD    Family History Family History  Problem Relation Age of Onset   Heart disease Mother    Rectal cancer Father    Social History Social History   Tobacco Use   Smoking status: Every Day    Packs/day: 1.00    Years: 35.00    Total pack years: 35.00    Types: Cigarettes   Smokeless tobacco: Never   Tobacco comments:    Considering Chantix, has tried patches   Allergies   Patient has no known allergies.  Review of Systems Review of Systems Pertinent findings revealed after performing a 14 point review of systems has been noted in the history of present illness.  Physical Exam Triage Vital Signs ED Triage Vitals  Enc Vitals Group     BP 05/06/21 0827  (!) 147/82     Pulse Rate 05/06/21 0827 72     Resp 05/06/21 0827 18     Temp 05/06/21 0827 98.3 F (36.8 C)     Temp Source 05/06/21 0827 Oral     SpO2 05/06/21 0827 98 %     Weight --      Height --      Head Circumference --      Peak Flow --      Pain Score 05/06/21 0826 5     Pain Loc --      Pain Edu? --      Excl. in GC? --   No data found.  Updated Vital Signs BP 128/69 (BP Location: Right Arm)   Pulse 91   Temp 98.7 F (37.1 C) (Oral)   Resp 19   Ht 5\' 8"  (1.727 m)   Wt 130 lb (59 kg)   SpO2 98%   BMI 19.77 kg/m   Physical Exam Vitals and nursing note reviewed.  Constitutional:      General: He is not in acute distress.    Appearance: Normal appearance. He is not ill-appearing.  HENT:     Head: Normocephalic and atraumatic.     Salivary Glands: Right salivary gland is not diffusely enlarged or tender.  Left salivary gland is not diffusely enlarged or tender.     Right Ear: Tympanic membrane, ear canal and external ear normal. No drainage. No middle ear effusion. There is no impacted cerumen. Tympanic membrane is not erythematous or bulging.     Left Ear: Tympanic membrane, ear canal and external ear normal. No drainage.  No middle ear effusion. There is no impacted cerumen. Tympanic membrane is not erythematous or bulging.     Nose: Nose normal. No nasal deformity, septal deviation, mucosal edema, congestion or rhinorrhea.     Right Turbinates: Not enlarged, swollen or pale.     Left Turbinates: Not enlarged, swollen or pale.     Right Sinus: No maxillary sinus tenderness or frontal sinus tenderness.     Left Sinus: No maxillary sinus tenderness or frontal sinus tenderness.     Mouth/Throat:     Lips: Pink. No lesions.     Mouth: Mucous membranes are moist. No oral lesions.     Pharynx: Oropharynx is clear. Uvula midline. No posterior oropharyngeal erythema or uvula swelling.     Tonsils: No tonsillar exudate. 0 on the right. 0 on the left.  Eyes:      General: Lids are normal.        Right eye: No discharge.        Left eye: No discharge.     Extraocular Movements: Extraocular movements intact.     Conjunctiva/sclera: Conjunctivae normal.     Right eye: Right conjunctiva is not injected.     Left eye: Left conjunctiva is not injected.  Neck:     Trachea: Trachea and phonation normal.  Cardiovascular:     Rate and Rhythm: Normal rate and regular rhythm.     Pulses: Normal pulses.     Heart sounds: Normal heart sounds. No murmur heard.    No friction rub. No gallop.  Pulmonary:     Effort: Pulmonary effort is normal. No accessory muscle usage, prolonged expiration or respiratory distress.     Breath sounds: Normal breath sounds. No stridor, decreased air movement or transmitted upper airway sounds. No decreased breath sounds, wheezing, rhonchi or rales.  Chest:     Chest wall: No tenderness.  Musculoskeletal:        General: Normal range of motion.     Cervical back: Normal range of motion and neck supple. Normal range of motion.  Lymphadenopathy:     Cervical: No cervical adenopathy.  Skin:    General: Skin is warm and dry.     Findings: No erythema or rash.  Neurological:     General: No focal deficit present.     Mental Status: He is alert and oriented to person, place, and time.  Psychiatric:        Mood and Affect: Mood normal.        Behavior: Behavior normal.     Visual Acuity Right Eye Distance:   Left Eye Distance:   Bilateral Distance:    Right Eye Near:   Left Eye Near:    Bilateral Near:     UC Couse / Diagnostics / Procedures:     Radiology No results found.  Procedures Procedures (including critical care time) EKG  Pending results:  Labs Reviewed  SARS CORONAVIRUS 2 (TAT 6-24 HRS)    Medications Ordered in UC: Medications - No data to display  UC Diagnoses / Final Clinical Impressions(s)   I have reviewed the triage vital signs and the nursing notes.  Pertinent labs &  imaging results  that were available during my care of the patient were reviewed by me and considered in my medical decision making (see chart for details).    Final diagnoses:  Encounter for screening for COVID-19  COVID-19 test performed, will notify patient of results once complete.  If positive, patient would benefit from Paxlovid however we do not have a recent GFR here at [facility].  Patient states he had his routine lab work done few months ago by a PCP at [facility] and he was advised that his kidney function is normal.  For this reason, I believe it would be safe to prescribe Paxlovid for him without repeating his GFR.  Of note, patient has been advised that he would need to reduce his dose of Xanax by half while taking Paxlovid, patient responded stating he no longer takes Xanax.  Paxlovid is safe to take with Euthyrox and tizanidine, no dose adjustments are needed.  Please see discharge instructions below for further details of plan of care as provided to patient. ED Prescriptions   None    I have reviewed the PDMP during this encounter.  Disposition Upon Discharge:  Condition: stable for discharge home Home: take medications as prescribed; routine discharge instructions as discussed; follow up as advised.  Patient presented with an acute illness with associated systemic symptoms and significant discomfort requiring urgent management. In my opinion, this is a condition that a prudent lay person (someone who possesses an average knowledge of health and medicine) may potentially expect to result in complications if not addressed urgently such as respiratory distress, impairment of bodily function or dysfunction of bodily organs.   Routine symptom specific, illness specific and/or disease specific instructions were discussed with the patient and/or caregiver at length.   As such, the patient has been evaluated and assessed, work-up was performed and treatment was provided in alignment with urgent  care protocols and evidence based medicine.  Patient/parent/caregiver has been advised that the patient may require follow up for further testing and treatment if the symptoms continue in spite of treatment, as clinically indicated and appropriate.  If the patient was tested for COVID-19, Influenza and/or RSV, then the patient/parent/guardian was advised to isolate at home pending the results of his/her diagnostic coronavirus test and potentially longer if they're positive. I have also advised pt that if his/her COVID-19 test returns positive, it's recommended to self-isolate for at least 10 days after symptoms first appeared AND until fever-free for 24 hours without fever reducer AND other symptoms have improved or resolved. Discussed self-isolation recommendations as well as instructions for household member/close contacts as per the [facility] and [place] DHHS, and also gave patient the COVID packet with this information.  Patient/parent/caregiver has been advised to return to the [facility] or PCP in 3-5 days if no better; to PCP or the Emergency Department if new signs and symptoms develop, or if the current signs or symptoms continue to change or worsen for further workup, evaluation and treatment as clinically indicated and appropriate  The patient will follow up with their current PCP if and as advised. If the patient does not currently have a PCP we will assist them in obtaining one.   The patient may need specialty follow up if the symptoms continue, in spite of conservative treatment and management, for further workup, evaluation, consultation and treatment as clinically indicated and appropriate.  Patient/parent/caregiver verbalized understanding and agreement of plan as discussed.  All questions were addressed during visit.  Please see discharge  instructions below for further details of plan.  Discharge Instructions:   Discharge Instructions      I provided you with information regarding COVID-19.  The  results of your test will be back tomorrow evening and will initially be posted to your MyChart account.    If the result is positive, you will be contacted by phone with further recommendations.  Due to the duration of your symptoms and your age, you would certainly qualify for Paxlovid.  If the result is negative, strongly encourage you to consider repeating the COVID test after 3 days if you are still feeling unwell.  Thank you for visiting urgent care today.  Please do not hesitate to let us know if there is anything else we can do for you.      This office note has been dictated using Teaching laboratory technician.  Unfortunately, this method of dictation can sometimes lead to typographical or grammatical errors.  I apologize for your inconvenience in advance if this occurs.  Please do not hesitate to reach out to me if clarification is needed.      Theadora Rama Scales, PA-C 07/18/22 1545    Theadora Rama Scales, PA-C 07/18/22 1550

## 2022-07-18 NOTE — ED Triage Notes (Signed)
Pt c/o cold like symptoms for the past 2 days with cough, chills, dizziness and body aches. Will like to be check for Covid.

## 2022-07-18 NOTE — Discharge Instructions (Addendum)
I provided you with information regarding COVID-19.  The results of your test will be back tomorrow evening and will initially be posted to your MyChart account.    If the result is positive, you will be contacted by phone with further recommendations.  Due to the duration of your symptoms and your age, you would certainly qualify for Paxlovid.  If the result is negative, strongly encourage you to consider repeating the COVID test after 3 days if you are still feeling unwell.  Thank you for visiting urgent care today.  Please do not hesitate to let us know if there is anything else we can do for you.

## 2022-07-19 ENCOUNTER — Telehealth (HOSPITAL_COMMUNITY): Payer: Self-pay | Admitting: Emergency Medicine

## 2022-07-19 LAB — SARS CORONAVIRUS 2 (TAT 6-24 HRS): SARS Coronavirus 2: POSITIVE — AB

## 2022-07-19 MED ORDER — NIRMATRELVIR/RITONAVIR (PAXLOVID)TABLET: 3.0000 | ORAL_TABLET | Freq: Two times a day (BID) | ORAL | 0 refills | Status: AC

## 2022-08-21 ENCOUNTER — Ambulatory Visit (HOSPITAL_BASED_OUTPATIENT_CLINIC_OR_DEPARTMENT_OTHER): Admission: RE | Admit: 2022-08-21 | Payer: BC Managed Care – PPO | Source: Ambulatory Visit

## 2022-10-05 DIAGNOSIS — N4232 Atypical small acinar proliferation of prostate: Secondary | ICD-10-CM | POA: Diagnosis not present

## 2022-10-05 DIAGNOSIS — R972 Elevated prostate specific antigen [PSA]: Secondary | ICD-10-CM | POA: Diagnosis not present

## 2022-12-27 DIAGNOSIS — N400 Enlarged prostate without lower urinary tract symptoms: Secondary | ICD-10-CM | POA: Diagnosis not present

## 2023-01-03 DIAGNOSIS — N400 Enlarged prostate without lower urinary tract symptoms: Secondary | ICD-10-CM | POA: Diagnosis not present

## 2023-01-03 DIAGNOSIS — R972 Elevated prostate specific antigen [PSA]: Secondary | ICD-10-CM | POA: Diagnosis not present

## 2023-01-09 DIAGNOSIS — R634 Abnormal weight loss: Secondary | ICD-10-CM | POA: Diagnosis not present

## 2023-01-09 DIAGNOSIS — Z0000 Encounter for general adult medical examination without abnormal findings: Secondary | ICD-10-CM | POA: Diagnosis not present

## 2023-01-09 DIAGNOSIS — I700 Atherosclerosis of aorta: Secondary | ICD-10-CM | POA: Diagnosis not present

## 2023-01-16 ENCOUNTER — Other Ambulatory Visit: Payer: Self-pay | Admitting: Registered Nurse

## 2023-01-16 DIAGNOSIS — F17200 Nicotine dependence, unspecified, uncomplicated: Secondary | ICD-10-CM

## 2023-04-06 ENCOUNTER — Encounter: Payer: Self-pay | Admitting: Registered Nurse

## 2023-04-17 ENCOUNTER — Other Ambulatory Visit: Payer: BC Managed Care – PPO

## 2023-04-24 ENCOUNTER — Inpatient Hospital Stay
Admission: RE | Admit: 2023-04-24 | Discharge: 2023-04-24 | Disposition: A | Discharge status: Home / Self Care | Payer: BC Managed Care – PPO | Source: Ambulatory Visit | Attending: Registered Nurse | Admitting: Registered Nurse

## 2023-04-24 DIAGNOSIS — Z87891 Personal history of nicotine dependence: Secondary | ICD-10-CM | POA: Diagnosis not present

## 2023-04-24 DIAGNOSIS — F17200 Nicotine dependence, unspecified, uncomplicated: Secondary | ICD-10-CM

## 2023-05-03 ENCOUNTER — Encounter: Payer: Self-pay | Admitting: Dermatology

## 2023-05-03 ENCOUNTER — Ambulatory Visit: Payer: BC Managed Care – PPO | Admitting: Dermatology

## 2023-05-03 DIAGNOSIS — C44729 Squamous cell carcinoma of skin of left lower limb, including hip: Secondary | ICD-10-CM

## 2023-05-03 DIAGNOSIS — D485 Neoplasm of uncertain behavior of skin: Secondary | ICD-10-CM

## 2023-05-03 DIAGNOSIS — D492 Neoplasm of unspecified behavior of bone, soft tissue, and skin: Secondary | ICD-10-CM | POA: Diagnosis not present

## 2023-05-03 NOTE — Progress Notes (Signed)
   New Patient Visit   Subjective  Brandon Heath is a 67 y.o. male who presents for the following: growth on left knee, present for several months, not painful but getting stuck on clothing and sharp to the touch   The patient has spots, moles and lesions to be evaluated, some may be new or changing   The following portions of the chart were reviewed this encounter and updated as appropriate: medications, allergies, medical history  Review of Systems:  No other skin or systemic complaints except as noted in HPI or Assessment and Plan.  Objective   A focused examination was performed of the following areas: Left knee  Relevant exam findings are noted in the Assessment and Plan.  Left Knee Hyperkeratotic plaque        Assessment & Plan    Neoplasm of uncertain behavior of skin Left Knee  Skin / nail biopsy Type of biopsy: tangential   Informed consent: discussed and consent obtained   Timeout: patient name, date of birth, surgical site, and procedure verified   Procedure prep:  Patient was prepped and draped in usual sterile fashion Prep type:  Isopropyl alcohol Anesthesia: the lesion was anesthetized in a standard fashion   Anesthetic:  1% lidocaine w/ epinephrine 1-100,000 local infiltration Instrument used: DermaBlade   Outcome: patient tolerated procedure well   Post-procedure details: wound care instructions given    Specimen 1 - Surgical pathology Differential Diagnosis: R/O NMSC vs other  Check Margins: No    Return if symptoms worsen or fail to improve.  I, Tillie Fantasia, CMA, am acting as scribe for Gwenith Daily, MD.   Documentation: I have reviewed the above documentation for accuracy and completeness, and I agree with the above.  Gwenith Daily, MD

## 2023-05-03 NOTE — Patient Instructions (Signed)

## 2023-05-07 LAB — SURGICAL PATHOLOGY

## 2023-05-15 ENCOUNTER — Telehealth: Payer: Self-pay

## 2023-05-15 NOTE — Telephone Encounter (Signed)
Spoke with pt and gave him bx results and told him he'd need excision

## 2023-05-15 NOTE — Telephone Encounter (Signed)
-----   Message from Brandon Heath PACI sent at 05/08/2023  9:44 AM EDT ----- Please call patient to discuss results showing SCC on left knee and get him scheduled for excision with me.

## 2023-06-05 ENCOUNTER — Ambulatory Visit: Payer: BC Managed Care – PPO | Admitting: Dermatology

## 2023-06-05 ENCOUNTER — Ambulatory Visit (INDEPENDENT_AMBULATORY_CARE_PROVIDER_SITE_OTHER): Payer: BC Managed Care – PPO | Admitting: Dermatology

## 2023-06-05 ENCOUNTER — Encounter: Payer: Self-pay | Admitting: Dermatology

## 2023-06-05 VITALS — BP 103/60 | HR 81

## 2023-06-05 DIAGNOSIS — C4492 Squamous cell carcinoma of skin, unspecified: Secondary | ICD-10-CM

## 2023-06-05 DIAGNOSIS — L988 Other specified disorders of the skin and subcutaneous tissue: Secondary | ICD-10-CM | POA: Diagnosis not present

## 2023-06-05 DIAGNOSIS — C44729 Squamous cell carcinoma of skin of left lower limb, including hip: Secondary | ICD-10-CM

## 2023-06-05 MED ORDER — MUPIROCIN 2 % EX OINT: 1.0000 | TOPICAL_OINTMENT | Freq: Two times a day (BID) | CUTANEOUS | 0 refills | Status: DC

## 2023-06-05 NOTE — Progress Notes (Signed)
   Follow-Up Visit   Subjective  Brandon Heath is a 67 y.o. male who presents for the following: Excision of a SCC on the pt's left knee, biopsied by Dr. Caralyn Guile  The following portions of the chart were reviewed this encounter and updated as appropriate: medications, allergies, medical history  Review of Systems:  No other skin or systemic complaints except as noted in HPI or Assessment and Plan.  Objective  Well appearing patient in no apparent distress; mood and affect are within normal limits.  A focused examination was performed of the following areas:  Left knee  Relevant physical exam findings are noted in the Assessment and Plan.   Left Knee - Anterior Pink pearly papule or plaque with arborizing vessels.           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  Skin excision  Lesion length (cm):  1.2 Lesion width (cm):  1.3 Margin per side (cm):  0.4 Total excision diameter (cm):  2.1 Informed consent: discussed and consent obtained   Timeout: patient name, date of birth, surgical site, and procedure verified   Procedure prep:  Patient was prepped and draped in usual sterile fashion Prep type:  Chlorhexidine Anesthesia: the lesion was anesthetized in a standard fashion   Anesthetic:  1% lidocaine w/ epinephrine 1-100,000 buffered w/ 8.4% NaHCO3 Instrument used: #15 blade   Hemostasis achieved with: suture, pressure and electrodesiccation   Hemostasis achieved with comment:  3.0 PDS, 5.0 fast absorbing Outcome: patient tolerated procedure well with no complications   Post-procedure details: sterile dressing applied and wound care instructions given   Dressing type: pressure dressing and petrolatum    Skin repair Complexity:  Intermediate Final length (cm):  4.4 Informed consent: discussed and consent obtained   Timeout: patient name, date of birth, surgical site, and procedure verified   Procedure prep:  Patient was prepped and  draped in usual sterile fashion Prep type:  Chlorhexidine Anesthesia: the lesion was anesthetized in a standard fashion   Anesthetic:  1% lidocaine w/ epinephrine 1-100,000 buffered w/ 8.4% NaHCO3 Undermining: edges could be approximated without difficulty   Subcutaneous layers (deep stitches):  Suture size:  3-0 Suture type: PDS (polydioxanone)   Stitches:  Buried vertical mattress Fine/surface layer approximation (top stitches):  Suture size:  5-0 Suture type: fast-absorbing plain gut   Stitches: simple running   Hemostasis achieved with: suture, pressure and electrodesiccation Outcome: patient tolerated procedure well with no complications   Post-procedure details: sterile dressing applied and wound care instructions given   Dressing type: petrolatum and pressure dressing    Specimen 1 - Surgical pathology Differential Diagnosis: SCC [id number] Check Margins: No    Return if symptoms worsen or fail to improve.  I, Tillie Fantasia, CMA, am acting as scribe for Gwenith Daily, MD.   Documentation: I have reviewed the above documentation for accuracy and completeness, and I agree with the above.  Gwenith Daily, MD

## 2023-06-05 NOTE — Patient Instructions (Signed)

## 2023-06-11 LAB — SURGICAL PATHOLOGY

## 2023-06-19 DIAGNOSIS — R972 Elevated prostate specific antigen [PSA]: Secondary | ICD-10-CM | POA: Diagnosis not present

## 2023-07-25 ENCOUNTER — Other Ambulatory Visit: Payer: Self-pay | Admitting: *Deleted

## 2023-07-25 DIAGNOSIS — Z122 Encounter for screening for malignant neoplasm of respiratory organs: Secondary | ICD-10-CM

## 2023-07-25 DIAGNOSIS — Z87891 Personal history of nicotine dependence: Secondary | ICD-10-CM

## 2023-07-25 DIAGNOSIS — F17210 Nicotine dependence, cigarettes, uncomplicated: Secondary | ICD-10-CM

## 2023-09-10 DIAGNOSIS — M25512 Pain in left shoulder: Secondary | ICD-10-CM | POA: Diagnosis not present

## 2023-09-10 DIAGNOSIS — M542 Cervicalgia: Secondary | ICD-10-CM | POA: Diagnosis not present

## 2023-09-10 DIAGNOSIS — G4489 Other headache syndrome: Secondary | ICD-10-CM | POA: Diagnosis not present

## 2023-09-10 DIAGNOSIS — B029 Zoster without complications: Secondary | ICD-10-CM | POA: Diagnosis not present

## 2024-01-14 DIAGNOSIS — E039 Hypothyroidism, unspecified: Secondary | ICD-10-CM | POA: Diagnosis not present

## 2024-01-14 DIAGNOSIS — Z125 Encounter for screening for malignant neoplasm of prostate: Secondary | ICD-10-CM | POA: Diagnosis not present

## 2024-01-14 DIAGNOSIS — E7800 Pure hypercholesterolemia, unspecified: Secondary | ICD-10-CM | POA: Diagnosis not present

## 2024-01-14 DIAGNOSIS — R972 Elevated prostate specific antigen [PSA]: Secondary | ICD-10-CM | POA: Diagnosis not present

## 2024-01-16 DIAGNOSIS — I700 Atherosclerosis of aorta: Secondary | ICD-10-CM | POA: Diagnosis not present

## 2024-01-16 DIAGNOSIS — Z23 Encounter for immunization: Secondary | ICD-10-CM | POA: Diagnosis not present

## 2024-01-16 DIAGNOSIS — F17200 Nicotine dependence, unspecified, uncomplicated: Secondary | ICD-10-CM | POA: Diagnosis not present

## 2024-01-16 DIAGNOSIS — Z0000 Encounter for general adult medical examination without abnormal findings: Secondary | ICD-10-CM | POA: Diagnosis not present

## 2024-01-16 DIAGNOSIS — Z8249 Family history of ischemic heart disease and other diseases of the circulatory system: Secondary | ICD-10-CM | POA: Diagnosis not present

## 2024-01-31 DIAGNOSIS — Z800 Family history of malignant neoplasm of digestive organs: Secondary | ICD-10-CM | POA: Insufficient documentation

## 2024-01-31 DIAGNOSIS — Z860100 Personal history of colon polyps, unspecified: Secondary | ICD-10-CM | POA: Insufficient documentation

## 2024-01-31 DIAGNOSIS — Z1211 Encounter for screening for malignant neoplasm of colon: Secondary | ICD-10-CM | POA: Insufficient documentation

## 2024-01-31 DIAGNOSIS — R262 Difficulty in walking, not elsewhere classified: Secondary | ICD-10-CM | POA: Insufficient documentation

## 2024-02-05 ENCOUNTER — Encounter: Payer: Self-pay | Admitting: Neurology

## 2024-02-05 ENCOUNTER — Ambulatory Visit (INDEPENDENT_AMBULATORY_CARE_PROVIDER_SITE_OTHER): Admitting: Neurology

## 2024-02-05 VITALS — BP 125/66 | HR 75 | Resp 15 | Ht 68.0 in | Wt 121.2 lb

## 2024-02-05 DIAGNOSIS — Z9889 Other specified postprocedural states: Secondary | ICD-10-CM | POA: Diagnosis not present

## 2024-02-05 DIAGNOSIS — R269 Unspecified abnormalities of gait and mobility: Secondary | ICD-10-CM | POA: Diagnosis not present

## 2024-02-05 NOTE — Progress Notes (Signed)
 Chief Complaint  Patient presents with   New Patient (Initial Visit)    Rm16, alone, referral for poor balance, trouble walking/Brandon Prevost, FNP [facility]: pt has terrible gait with bouncing and gotten worse in past yr. Had singles and since shingles has numbness on left shoulder and three fingers on left hand. Numbness in 3 fingers on right hand since spinal surgery. Pt stated that he feels way off balance    ASSESSMENT AND PLAN  Brandon Heath is a 68 y.o. male   History of cervical decompression due to cervical myelopathy 30 years ago after fall accident  He had a residual mild left gait abnormality, acute worsening since 2024, examination showed brisk reflex bilateral Babinski Hoffmann signs, also bilateral bilateral Internuclear Ophthalmoplegia  MRI of the brain with without contrast, cervical spine  Return to clinic in 4 weeks  DIAGNOSTIC DATA (LABS, IMAGING, TESTING) - I reviewed patient records, labs, notes, testing and imaging myself where available.   MEDICAL HISTORY:  Brandon Heath is a 68 year old male, seen in request by his primary care from [facility] Dr. Clarice, Ryan for evaluation of gait abnormality, intermittent blurry vision, initial evaluation February 05, 2024  History is obtained from the patient and review of electronic medical records. I personally reviewed pertinent available imaging films in PACS.   PMHx of  Cervical decompression at age 54s. Smoke 1ppd.  He reported history of cervical decompression surgery about 30 years ago following a fall accident, developed gait abnormality, required prolonged rehabilitation after surgery, with mild residual balance issues  He works a Office manager, around 2023, he noticed slow worsening gait abnormality, after he lost his dog couple years ago, he spent most of the time in the sitting position, continued to progress over the past couple years, also had a shingle in February 2025 involving left  shoulder, acute worsening since then, now complains of bilateral hands numbness tingling involving first to 3 fingers, worsening constipation,  He also has intermittent blurry vision, deny cognitive impairment  His mother just died of rapid progressive dementia at age 22, she progressed from independent to death in 2 years  PHYSICAL EXAM:   Vitals:   02/05/24 1435  BP: 125/66  Pulse: 75  Resp: 15  SpO2: 95%  Weight: 121 lb 3.2 oz (55 kg)  Height: 5' 8 (1.727 m)     Body mass index is 18.43 kg/m.  PHYSICAL EXAMNIATION:  Gen: NAD, conversant, well nourised, well groomed                     Cardiovascular: Regular rate rhythm, no peripheral edema, warm, nontender. Eyes: Conjunctivae clear without exudates or hemorrhage Neck: Supple, no carotid bruits. Pulmonary: Clear to auscultation bilaterally   NEUROLOGICAL EXAM:  MENTAL STATUS: Speech/cognition: Awake, alert, oriented to history taking and casual conversation CRANIAL NERVES: CN II: Visual fields are full to confrontation. Pupils are round equal and briskly reactive to light. CN III, IV, VI: There is bilateral Internuclear Ophthalmoplegia, limited adduction of bilateral eye, with horizontal nystagmus on abducted eye on either left or right CN V: Facial sensation is intact to light touch CN VII: Face is symmetric with normal eye closure  CN VIII: Hearing is normal to causal conversation. CN IX, X: Phonation is normal. CN XI: Head turning and shoulder shrug are intact  MOTOR: Mild left shoulder abduction, external rotation weakness, mild to moderate bilateral hip flexion weakness  REFLEXES: Reflexes are  3 and symmetric at the biceps,  triceps, knees, and ankles with sustained ankle clonus, plantar responses are extensor bilaterally, bilateral Hoffmann sign  SENSORY: Mildly length-dependent decreased light touch, vibratory sensation and pinprick  COORDINATION: There is no trunk or limb dysmetria noted.  GAIT/STANCE:  Push-up, stiff, unsteady,  REVIEW OF SYSTEMS:  Full 14 system review of systems performed and notable only for as above All other review of systems were negative.   ALLERGIES: No Known Allergies  HOME MEDICATIONS: Current Outpatient Medications  Medication Sig Dispense Refill   escitalopram (LEXAPRO) 5 MG tablet Take 5 mg by mouth daily.     No current facility-administered medications for this visit.    PAST MEDICAL HISTORY: Past Medical History:  Diagnosis Date   Anxiety    Cervical myelopathy (HCC) 04/16/2019   Depression    Gait disorder 04/16/2019    PAST SURGICAL HISTORY: Past Surgical History:  Procedure Laterality Date   LAMINECTOMY     cervical    TONSILLECTOMY      FAMILY HISTORY: Family History  Problem Relation Age of Onset   Heart disease Mother    Rectal cancer Father     SOCIAL HISTORY: Social History   Socioeconomic History   Marital status: Married    Spouse name: Not on file   Number of children: Not on file   Years of education: Not on file   Highest education level: Not on file  Occupational History   Not on file  Tobacco Use   Smoking status: Every Day    Current packs/day: 1.00    Average packs/day: 1 pack/day for 35.0 years (35.0 ttl pk-yrs)    Types: Cigarettes   Smokeless tobacco: Never   Tobacco comments:    Considering Chantix, has tried patches  Substance and Sexual Activity   Alcohol use: Not on file   Drug use: Not on file   Sexual activity: Not on file  Other Topics Concern   Not on file  Social History Narrative   Right handed    Lives at home with spouse    18 oz per day    Social Drivers of Health   Financial Resource Strain: Not on file  Food Insecurity: Not on file  Transportation Needs: Not on file  Physical Activity: Not on file  Stress: Not on file  Social Connections: Not on file  Intimate Partner Violence: Not on file      Modena Callander, M.D. Ph.D.  [facility] [address]  [address] [place], [place] [address] Ph: [phone] Fax: [phone]  CC:  Royden Ronal Czar, FNP [address] [place],  [place] [address]  Clarice Nottingham, MD

## 2024-02-13 ENCOUNTER — Ambulatory Visit

## 2024-02-13 ENCOUNTER — Telehealth: Payer: Self-pay | Admitting: Neurology

## 2024-02-13 ENCOUNTER — Ambulatory Visit: Payer: Self-pay | Admitting: Neurology

## 2024-02-13 DIAGNOSIS — Z9889 Other specified postprocedural states: Secondary | ICD-10-CM

## 2024-02-13 DIAGNOSIS — R269 Unspecified abnormalities of gait and mobility: Secondary | ICD-10-CM

## 2024-02-13 MED ORDER — GADOBENATE DIMEGLUMINE 529 MG/ML IV SOLN
10.0000 mL | Freq: Once | INTRAVENOUS | Status: AC | PRN
  Administered 2024-02-13: 10 mL via INTRAVENOUS

## 2024-02-13 NOTE — Telephone Encounter (Signed)
 Please call patient, MRI cervical spine showed stable post surgical changes, there was no evidence of cord compression  MRI of brain was unremarkable.  Orders Placed This Encounter  Procedures   MR THORACIC SPINE WO CONTRAST   NCV with EMG(electromyography)     I ordered MRI thoracic and EMG, change his August 28th appt to one hour EMG/NCS slot   IMPRESSION: MRI scan of the cervical spine with and without contrast showing stable changes of posterior decompression from C3-C6 with prominent disc and facet degenerative changes resulting in severe bilateral foraminal narrowing on the right at C3-4, on the left at C4-5 and bilaterally at C7-T1.  No abnormal postcontrast enhancement is noted.  Overall no significant change compared with previous MRI from 01/18/2021.     IMPRESSION: Unremarkable MRI scan of the brain with and without contrast.

## 2024-02-13 NOTE — Telephone Encounter (Signed)
 Pt aware and voiced understanding. Changed appt from 30 mins to 1 hr as directed by md

## 2024-02-19 ENCOUNTER — Ambulatory Visit

## 2024-02-19 DIAGNOSIS — R269 Unspecified abnormalities of gait and mobility: Secondary | ICD-10-CM | POA: Diagnosis not present

## 2024-02-19 DIAGNOSIS — Z9889 Other specified postprocedural states: Secondary | ICD-10-CM

## 2024-02-26 ENCOUNTER — Ambulatory Visit: Payer: Self-pay | Admitting: Neurology

## 2024-03-06 ENCOUNTER — Ambulatory Visit: Admitting: Neurology

## 2024-03-06 ENCOUNTER — Telehealth: Payer: Self-pay | Admitting: Neurology

## 2024-03-06 ENCOUNTER — Encounter: Payer: Self-pay | Admitting: Neurology

## 2024-03-06 VITALS — BP 114/60 | HR 92 | Resp 15 | Ht 67.0 in | Wt 121.3 lb

## 2024-03-06 DIAGNOSIS — R270 Ataxia, unspecified: Secondary | ICD-10-CM

## 2024-03-06 DIAGNOSIS — R269 Unspecified abnormalities of gait and mobility: Secondary | ICD-10-CM

## 2024-03-06 DIAGNOSIS — Z9889 Other specified postprocedural states: Secondary | ICD-10-CM

## 2024-03-06 NOTE — Progress Notes (Addendum)
 Chief Complaint  Patient presents with   ncs/emg     Emg rm 3, alone, pt is well and ready for nerve conduction    ASSESSMENT AND PLAN  Brandon Heath is a 68 y.o. male   History of cervical decompression due to cervical myelopathy 30 years ago after fall accident  He only had mild residual gait abnormality over the years,  Had acute worsening over the past couple years, examination showed prominent cerebellum signs, ataxic gait, hyperreflexia, Evidence of previous cervical decompression, C3 myelomalacia, patent cervical cord, no other structural abnormality to explain above cerebellum signs, Differentiation diagnosis includes central nervous system degenerative disorder, paraneoplastic syndrome, nutritional deficiency, Extensive laboratory evaluations Physical therapy,  he would benefit from bilateral toe caps and 2WW  Return in 9 to 12 months, may consider academic neurology refer  DIAGNOSTIC DATA (LABS, IMAGING, TESTING) - I reviewed patient records, labs, notes, testing and imaging myself where available.   MEDICAL HISTORY:  Brandon Heath is a 68 year old male, seen in request by his primary care from [facility] Dr. Clarice, Ryan for evaluation of gait abnormality, intermittent blurry vision, initial evaluation February 05, 2024  History is obtained from the patient and review of electronic medical records. I personally reviewed pertinent available imaging films in PACS.   PMHx of  Cervical decompression at age 43s. Smoke 1ppd.  He reported history of cervical decompression surgery about 30 years ago following a fall accident, developed gait abnormality, required prolonged rehabilitation after surgery, with mild residual balance issues  He works a health and safety inspector job, around 2023, he noticed slow worsening gait abnormality, after he lost his dog couple years ago, he spent most of the time in the sitting position, continued to progress over the past couple years, also had a  shingle in February 2025 involving left shoulder, acute worsening since then, now complains of bilateral hands numbness tingling involving first to 3 fingers, worsening constipation,  He also has intermittent blurry vision, deny cognitive impairment  His mother just died of rapid progressive dementia at age 43, she progressed from independent to death in 2 years  UPDATE 03-20-2024: Had extensive evaluation for his complaints of slow Worsening gait abnormality, also complains of difficulty focusing  Reviewed with patient MRIs from August 2025,  MRI of the brain unremarkable, in specific, no evidence of cerebellum or brainstem atrophy MRI of cervical with without contrast, evidence of posterior decompression from C3-6, patent canal, myelomalacia at C3 MRI of thoracic spine normal  EMG nerve conduction study today showed mild left lower cervical radiculopathy, otherwise no significant abnormality  PHYSICAL EXAM:   Vitals:   2024/03/20 1436  BP: 114/60  Pulse: 92  Resp: 15  SpO2: 97%  Weight: 121 lb 4.1 oz (55 kg)  Height: 5' 7 (1.702 m)     Body mass index is 18.99 kg/m.  PHYSICAL EXAMNIATION:  Gen: NAD, conversant, well nourised, well groomed                     Cardiovascular: Regular rate rhythm, no peripheral edema, warm, nontender. Eyes: Conjunctivae clear without exudates or hemorrhage Neck: Supple, no carotid bruits. Pulmonary: Clear to auscultation bilaterally   NEUROLOGICAL EXAM:  MENTAL STATUS: Speech/cognition: Awake, alert, oriented to history taking and casual conversation CRANIAL NERVES: CN II: Visual fields are full to confrontation. Pupils are round equal and briskly reactive to light. CN III, IV, VI: There is horizontal nystagmus on lateral gaze CN V: Facial sensation is intact  to light touch CN VII: Face is symmetric with normal eye closure  CN VIII: Hearing is normal to causal conversation. CN IX, X: Phonation is normal. CN XI: Head turning and  shoulder shrug are intact  MOTOR: No significant proximal and distal muscle weakness  REFLEXES: Reflexes are  3 and symmetric at the biceps, triceps, knees, and ankles with sustained ankle clonus, plantar responses are extensor bilaterally, bilateral Hoffmann sign  SENSORY: Normal to light touch pinprick vibratory sensation  COORDINATION: Moderate finger-to-nose heel-to-shin dysmetria, Dysdiadochokinesia of bilateral upper extremity, moderate truncal ataxia  GAIT/STANCE: Push-up, stiff, unsteady, ataxic gait  REVIEW OF SYSTEMS:  Full 14 system review of systems performed and notable only for as above All other review of systems were negative.   ALLERGIES: No Known Allergies  HOME MEDICATIONS: Current Outpatient Medications  Medication Sig Dispense Refill   escitalopram (LEXAPRO) 5 MG tablet Take 5 mg by mouth daily.     No current facility-administered medications for this visit.    PAST MEDICAL HISTORY: Past Medical History:  Diagnosis Date   Anxiety    Cervical myelopathy (HCC) 04/16/2019   Depression    Gait disorder 04/16/2019    PAST SURGICAL HISTORY: Past Surgical History:  Procedure Laterality Date   LAMINECTOMY     cervical    TONSILLECTOMY      FAMILY HISTORY: Family History  Problem Relation Age of Onset   Heart disease Mother    Rectal cancer Father     SOCIAL HISTORY: Social History   Socioeconomic History   Marital status: Married    Spouse name: Not on file   Number of children: Not on file   Years of education: Not on file   Highest education level: Not on file  Occupational History   Not on file  Tobacco Use   Smoking status: Every Day    Current packs/day: 1.00    Average packs/day: 1 pack/day for 35.0 years (35.0 ttl pk-yrs)    Types: Cigarettes   Smokeless tobacco: Never   Tobacco comments:    Considering Chantix, has tried patches  Substance and Sexual Activity   Alcohol use: Not on file   Drug use: Not on file   Sexual  activity: Not on file  Other Topics Concern   Not on file  Social History Narrative   Right handed    Lives at home with spouse    18 oz per day    Social Drivers of Health   Financial Resource Strain: Not on file  Food Insecurity: Not on file  Transportation Needs: Not on file  Physical Activity: Not on file  Stress: Not on file  Social Connections: Not on file  Intimate Partner Violence: Not on file      Modena Callander, M.D. Ph.D.  [facility] [address] [place], [place] [address] Ph: [phone] Fax: [phone]  CC:  Clarice Nottingham, MD [address] [place],  [place] [address]  Clarice Nottingham, MD

## 2024-03-06 NOTE — Telephone Encounter (Signed)
 Referral  For Physical therapy sent thru epic to [facility]   [facility] Phone:[phone]

## 2024-03-07 NOTE — Procedures (Signed)
 Full Name: Brandon Heath Gender: Male MRN #: [id number] Date of Birth: 24-Mar-1956    Visit Date: 03/06/2024 15:08 Age: 68 Years Examining Physician: Onita Duos Referring Physician: Onita Duos Height: 5 feet 7 inch History: 68 year old male with history of cervical decompression surgery presenting with worsening gait abnormality  Summary of the test: Nerve conduction study: Left sural, superficial peroneal, median and ulnar sensory responses were normal.  Left peroneal to EDB, tibial, median and ulnar motor responses were normal.  Electromyography: Selected needle examination of the left upper, lower extremity muscles, cervical and lumbosacral paraspinal muscles were performed.  There is evidence of mild chronic neuropathic changes at the left distal cervical myotomes, involving left C7, T1.  There is no evidence of active process  Conclusion: This is a slight abnormal study.  There is evidence of marked chronic left cervical radiculopathy, involving left C7 and T1 myotomes.  There is no evidence of active process.   ------------------------------- Duos Onita. M.D. Ph.D.   [facility] [address], [address] [place], [place] [address] Tel: [phone] Fax: [phone]  Verbal informed consent was obtained from the patient, patient was informed of potential risk of procedure, including bruising, bleeding, hematoma formation, infection, muscle weakness, muscle pain, numbness, among others.        MNC    Nerve / Sites Muscle Latency Ref. Amplitude Ref. Rel Amp Segments Distance Velocity Ref. Area    ms ms mV mV %  cm m/s m/s mVms  L Median - APB     Wrist APB 3.6 <=4.4 2.8 >=4.0 100 Wrist - APB 7   10.0     Upper arm APB 7.7  2.9  105 Upper arm - Wrist 21 52 >=49 9.3  L Ulnar - ADM     Wrist ADM 3.2 <=3.3 13.4 >=6.0 100 Wrist - ADM 7   36.6     B.Elbow ADM 5.3  12.5  93.1 B.Elbow - Wrist 14 67 >=49 38.1     A.Elbow ADM 7.5  10.7  85.9 A.Elbow -  B.Elbow 14 65 >=49 35.5  L Peroneal - EDB     Ankle EDB 4.8 <=6.5 8.8 >=2.0 100 Ankle - EDB 9   31.6     Fib head EDB 11.4  8.9  101 Fib head - Ankle 29 44 >=44 33.5     Pop fossa EDB 13.9  11.4  128 Pop fossa - Fib head 12 48 >=44 45.6         Pop fossa - Ankle      L Tibial - AH     Ankle AH 3.8 <=5.8 18.2 >=4.0 100 Ankle - AH 9   47.7     Pop fossa AH 13.2  13.5  74.3 Pop fossa - Ankle 45 48 >=41 48.9             SNC    Nerve / Sites Rec. Site Peak Lat Ref.  Amp Ref. Segments Distance    ms ms V V  cm  L Sural - Ankle (Calf)     Calf Ankle 4.3 <=4.4 14 >=6 Calf - Ankle 14  L Superficial peroneal - Ankle     Lat leg Ankle 4.2 <=4.4 9 >=6 Lat leg - Ankle 14  L Median - Orthodromic (Dig II, Mid palm)     Dig II Wrist 3.2 <=3.4 22 >=10 Dig II - Wrist 13  L Ulnar - Orthodromic, (Dig V, Mid palm)  Dig V Wrist 2.9 <=3.1 7 >=5 Dig V - Wrist 29             F  Wave    Nerve F Lat Ref.   ms ms  L Tibial - AH 46.5 <=56.0  L Ulnar - ADM 28.0 <=32.0         EMG Summary Table    Spontaneous MUAP Recruitment  Muscle IA Fib PSW Fasc Other Amp Dur. Poly Pattern  L. Tibialis anterior Normal None None None _______ Normal Normal Normal Normal  L. Tibialis posterior Normal None None None _______ Normal Normal Normal Normal  L. Peroneus longus Normal None None None _______ Normal Normal Normal Normal  L. Gastrocnemius (Medial head) Normal None None None _______ Normal Normal Normal Normal  L. Vastus lateralis Normal None None None _______ Normal Normal Normal Normal  L. Lumbar paraspinals (low) Normal None None None _______ Normal Normal Normal Normal  L. First dorsal interosseous Normal None None None _______ Normal Normal Normal Reduced  L. Extensor digitorum communis Normal None None None _______ Normal Normal Normal Reduced  L. Biceps brachii Normal None None None _______ Normal Normal Normal Normal  L. Deltoid Normal None None None _______ Normal Normal Normal Normal  L. Triceps  brachii Normal None None None _______ Normal Normal Normal Normal  L. Cervical paraspinals Normal None None None _______ Normal Normal Normal Normal  L. Abductor pollicis brevis Normal None None None _______ Normal Normal Normal Reduced

## 2024-03-09 LAB — AUTOIMMUNE NEUROLOGY AB

## 2024-03-09 LAB — C-REACTIVE PROTEIN: CRP: <1 mg/L (ref 0–10)

## 2024-03-09 LAB — CBC WITH DIFFERENTIAL/PLATELET
Basophils Absolute: 0.1 x10E3/uL (ref 0.0–0.2)
Basos: 1 %
EOS (ABSOLUTE): 0.1 x10E3/uL (ref 0.0–0.4)
Eos: 1 %
Hematocrit: 50.9 % (ref 37.5–51.0)
Hemoglobin: 17 g/dL (ref 13.0–17.7)
Immature Grans (Abs): 0 x10E3/uL (ref 0.0–0.1)
Immature Granulocytes: 0 %
Lymphocytes Absolute: 2.2 x10E3/uL (ref 0.7–3.1)
Lymphs: 29 %
MCH: 32.8 pg (ref 26.6–33.0)
MCHC: 33.4 g/dL (ref 31.5–35.7)
MCV: 98 fL — ABNORMAL HIGH (ref 79–97)
Monocytes Absolute: 0.5 x10E3/uL (ref 0.1–0.9)
Monocytes: 7 %
Neutrophils Absolute: 4.6 x10E3/uL (ref 1.4–7.0)
Neutrophils: 62 %
Platelets: 170 x10E3/uL (ref 150–450)
RBC: 5.19 x10E6/uL (ref 4.14–5.80)
RDW: 12 % (ref 11.6–15.4)
WBC: 7.4 x10E3/uL (ref 3.4–10.8)

## 2024-03-09 LAB — COMPREHENSIVE METABOLIC PANEL WITH GFR
ALT: 12 IU/L (ref 0–44)
AST: 19 IU/L (ref 0–40)
Albumin: 4.7 g/dL (ref 3.9–4.9)
Alkaline Phosphatase: 74 IU/L (ref 44–121)
BUN/Creatinine Ratio: 12 (ref 10–24)
BUN: 13 mg/dL (ref 8–27)
Bilirubin Total: 0.4 mg/dL (ref 0.0–1.2)
CO2: 28 mmol/L (ref 20–29)
Calcium: 9.7 mg/dL (ref 8.6–10.2)
Chloride: 98 mmol/L (ref 96–106)
Creatinine, Ser: 1.12 mg/dL (ref 0.76–1.27)
Globulin, Total: 2.2 g/dL (ref 1.5–4.5)
Glucose: 101 mg/dL — ABNORMAL HIGH (ref 70–99)
Potassium: 4.4 mmol/L (ref 3.5–5.2)
Sodium: 139 mmol/L (ref 134–144)
Total Protein: 6.9 g/dL (ref 6.0–8.5)
eGFR: 72 mL/min/1.73 (ref >59)

## 2024-03-09 LAB — RPR: RPR Ser Ql: NONREACTIVE

## 2024-03-09 LAB — VITAMIN D 25 HYDROXY (VIT D DEFICIENCY, FRACTURES): Vit D, 25-Hydroxy: 20.6 ng/mL — ABNORMAL LOW (ref 30.0–100.0)

## 2024-03-09 LAB — HIV ANTIBODY (ROUTINE TESTING W REFLEX): HIV Screen 4th Generation wRfx: NONREACTIVE

## 2024-03-09 LAB — ACETYLCHOLINE RECEPTOR, BINDING: AChR Binding Ab, Serum: <0.07 nmol/L (ref 0.00–0.24)

## 2024-03-09 LAB — FOLATE: Folate: 11.2 ng/mL (ref >3.0)

## 2024-03-09 LAB — ANA W/REFLEX IF POSITIVE: Anti Nuclear Antibody (ANA): NEGATIVE

## 2024-03-09 LAB — CK: Total CK: 41 U/L (ref 41–331)

## 2024-03-09 LAB — HGB A1C W/O EAG: Hgb A1c MFr Bld: 5.5 % (ref 4.8–5.6)

## 2024-03-09 LAB — COPPER, SERUM: Copper: 101 ug/dL (ref 69–132)

## 2024-03-09 LAB — VITAMIN B12: Vitamin B-12: 436 pg/mL (ref 232–1245)

## 2024-03-09 LAB — CERULOPLASMIN: Ceruloplasmin: 22.9 mg/dL (ref 16.0–31.0)

## 2024-03-09 LAB — TSH: TSH: 2.02 u[IU]/mL (ref 0.450–4.500)

## 2024-03-09 LAB — SEDIMENTATION RATE: Sed Rate: 2 mm/h (ref 0–30)

## 2024-03-11 ENCOUNTER — Ambulatory Visit: Payer: Self-pay | Admitting: Neurology

## 2024-04-01 ENCOUNTER — Encounter: Payer: Self-pay | Admitting: Acute Care

## 2024-04-23 ENCOUNTER — Other Ambulatory Visit: Payer: Self-pay

## 2024-04-23 ENCOUNTER — Ambulatory Visit: Attending: Neurology | Admitting: Physical Therapy

## 2024-04-23 ENCOUNTER — Encounter: Payer: Self-pay | Admitting: Physical Therapy

## 2024-04-23 VITALS — BP 131/62 | HR 82

## 2024-04-23 DIAGNOSIS — R29898 Other symptoms and signs involving the musculoskeletal system: Secondary | ICD-10-CM | POA: Diagnosis not present

## 2024-04-23 DIAGNOSIS — R278 Other lack of coordination: Secondary | ICD-10-CM | POA: Insufficient documentation

## 2024-04-23 DIAGNOSIS — G119 Hereditary ataxia, unspecified: Secondary | ICD-10-CM | POA: Diagnosis not present

## 2024-04-23 DIAGNOSIS — R29818 Other symptoms and signs involving the nervous system: Secondary | ICD-10-CM | POA: Diagnosis not present

## 2024-04-23 DIAGNOSIS — R270 Ataxia, unspecified: Secondary | ICD-10-CM | POA: Diagnosis not present

## 2024-04-23 DIAGNOSIS — Z9889 Other specified postprocedural states: Secondary | ICD-10-CM | POA: Insufficient documentation

## 2024-04-23 DIAGNOSIS — R2689 Other abnormalities of gait and mobility: Secondary | ICD-10-CM | POA: Diagnosis not present

## 2024-04-23 DIAGNOSIS — R269 Unspecified abnormalities of gait and mobility: Secondary | ICD-10-CM | POA: Insufficient documentation

## 2024-04-23 DIAGNOSIS — R482 Apraxia: Secondary | ICD-10-CM | POA: Diagnosis not present

## 2024-04-23 DIAGNOSIS — R260 Ataxic gait: Secondary | ICD-10-CM | POA: Diagnosis not present

## 2024-04-23 DIAGNOSIS — R2681 Unsteadiness on feet: Secondary | ICD-10-CM | POA: Insufficient documentation

## 2024-04-23 NOTE — Therapy (Unsigned)
 OUTPATIENT PHYSICAL THERAPY NEURO EVALUATION   Patient Name: Brandon Heath MRN: [id number] DOB:1956/05/02, 68 y.o., male Today's Date: 04/23/2024   PCP: Clarice Nottingham, MD REFERRING PROVIDER: Onita Duos, MD  END OF SESSION:  PT End of Session - 04/23/24 1510     Visit Number 1    Number of Visits 17   16 + eval   Authorization Type BCBS    PT Start Time 1454    PT Stop Time 1541    PT Time Calculation (min) 47 min    Equipment Utilized During Treatment Gait belt    Activity Tolerance Patient tolerated treatment well    Behavior During Therapy WFL for tasks assessed/performed          Past Medical History:  Diagnosis Date   Anxiety    Cervical myelopathy (HCC) 04/16/2019   Depression    Gait disorder 04/16/2019   Past Surgical History:  Procedure Laterality Date   LAMINECTOMY     cervical    TONSILLECTOMY     Patient Active Problem List   Diagnosis Date Noted   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   Hx of excision of lamina of cervical vertebra for decompression of spinal cord 02/05/2024   Colon cancer screening 01/31/2024   Difficulty walking 01/31/2024   Family history of colon cancer 01/31/2024   History of colonic polyps 01/31/2024   Cervical myelopathy (HCC) 04/16/2019   Gait disorder 04/16/2019   Tobacco use 12/21/2011   Anxiety 12/06/2011   Muscle spasm of both lower legs 12/06/2011   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   Night sweats 11/20/2011   Skin mole 11/20/2011    ONSET DATE: 6-12 months ago  REFERRING DIAG: R26.9 (ICD-10-CM) - Gait disorder Z98.890 (ICD-10-CM) - Hx of excision of lamina of cervical vertebra for decompression of spinal cord R27.0 (ICD-10-CM) - Ataxia  THERAPY DIAG:  Ataxic gait  Unsteadiness on feet  Other abnormalities of gait and mobility  Other lack of coordination  Apraxia  Rationale for Evaluation and Treatment: Rehabilitation  SUBJECTIVE:                                                                                                                                                                                              SUBJECTIVE STATEMENT: ***Pt continues to work for [employer] - has motor deficits of hands (L worse).  Has some tingling in digits 1-3 bilaterally but left is new.  On observation he has hyperextension of bilateral fingers.  When just sitting still he feels perfectly normal.  2 years ago he was totally normal and able to move them out of his 2nd floor apt.  Pt accompanied by: self (pt drives himself)  PERTINENT HISTORY: ***  PAIN:  Are you having pain? No  PRECAUTIONS: Fall  RED FLAGS: None   WEIGHT BEARING RESTRICTIONS: No  FALLS: Has patient fallen in last 6 months? Yes. Number of falls >/=6; always falls forward onto hands and knees usually due to toe catch  LIVING ENVIRONMENT: Lives with: lives with their spouse - has 2 cats and small dog Lives in: House/apartment (1st floor apt) Stairs: No Has following equipment at home: Wheelchair (manual)  PLOF: Independent  PATIENT GOALS: walk to my desk or my car and not have it be an adventure  OBJECTIVE:  Note: Objective measures were completed at Evaluation unless otherwise noted.  DIAGNOSTIC FINDINGS: ***  COGNITION: Overall cognitive status: Within functional limits for tasks assessed   SENSATION: Light touch: WFL  COORDINATION: LE RAMS:  mildly slower, but coordinated Heel-to-shin:  WNL bilaterally  EDEMA:  None significant in BLE  MUSCLE TONE: LLE: Modifed Ashworth Scale 1+ = Slight increase in muscle tone, manifested by a catch, followed by minimal resistance throughout the remainder (less than half) of the ROM and Bilateral clonus and RLE tone MAS same as LLE  POSTURE: forward head and posterior pelvic tilt - mild forward head  LOWER EXTREMITY ROM:     Active  Right Eval Left Eval  Hip flexion WFL bilaterally  Hip extension   Hip abduction   Hip adduction   Hip internal rotation   Hip external rotation   Knee  flexion   Knee extension   Ankle dorsiflexion   Ankle plantarflexion   Ankle inversion    Ankle eversion     (Blank rows = not tested)  LOWER EXTREMITY MMT:    MMT Right Eval Left Eval  Hip flexion 4-/5 4-/5  Hip extension    Hip abduction 4/5 4/5  Hip adduction    Hip internal rotation    Hip external rotation    Knee flexion    Knee extension 5/5 5/5  Ankle dorsiflexion 4/5 4+/5  Ankle plantarflexion    Ankle inversion    Ankle eversion    (Blank rows = not tested)  BED MOBILITY:  Findings: Sit to supine Complete Independence Supine to sit Complete Independence Rolling to Right Complete Independence Rolling to Left Complete Independence  TRANSFERS: Sit to stand: SBA  Assistive device utilized: None     Stand to sit: SBA  Assistive device utilized: None     Chair to chair: SBA and CGA  Assistive device utilized: None       RAMP:  Not tested  CURB:  Not tested  STAIRS: Not tested - pt reports no trouble with stairs that he has noted GAIT: Findings: Gait Characteristics: step to pattern, step through pattern, decreased arm swing- Right, decreased arm swing- Left, decreased step length- Right, decreased step length- Left, decreased stride length, decreased hip/knee flexion- Right, decreased hip/knee flexion- Left, decreased ankle dorsiflexion- Right, decreased ankle dorsiflexion- Left, scissoring, ataxic, decreased trunk rotation, narrow BOS, poor foot clearance- Right, and poor foot clearance- Left, Distance walked: various clinic distances, Assistive device utilized:None, Level of assistance: SBA and CGA, and Comments: repeated toe catch bilaterally - shoe assessment shoes severely worn toes of shoes  FUNCTIONAL TESTS:  5 times sit to stand: 11.75 - posterior bias resulting in uncontrolled lower and poorly coordinated sequencing Timed up and go (TUG): TBA Berg Balance Scale: TBA   PATIENT SURVEYS:  ABC scale: The Activities-Specific Balance Confidence (ABC)  Scale  0% 10 20 30  40 50 60 70 80 90 100% No confidence<->completely confident  "How confident are you that you will not lose your balance or become unsteady when you . . .   Date tested 04/23/2024  Walk around the house 50%  2. Walk up or down stairs 100%  3. Bend over and pick up a slipper from in front of a closet floor 100%  4. Reach for a small can off a shelf at eye level 100%  5. Stand on tip toes and reach for something above your head 100%  6. Stand on a chair and reach for something 100%  7. Sweep the floor 100%  8. Walk outside the house to a car parked in the driveway 0%  9. Get into or out of a car 50%  10. Walk across a parking lot to the mall 0%  11. Walk up or down a ramp 0%  12. Walk in a crowded mall where people rapidly walk past you 0%  13. Are bumped into by people as you walk through the mall 0%  14. Step onto or off of an escalator while you are holding onto the railing 100%  15. Step onto or off an escalator while holding onto parcels such that you cannot hold onto the railing 50%  16. Walk outside on icy sidewalks 0%  Total: #/16 ***                                                                                                                                 TREATMENT DATE: 04/23/2024    PATIENT EDUCATION: Education details: ***AD options - pt has used a manual wheelchair before in place of walker and feels it did not help, may try standard walker, he is most open to a cane.  Safety w/ cane and concerns with ataxia.  AFO vs toe cap and benefits.  Grab bars in bathroom.   Person educated: {Person educated:25204} Education method: {Education Method:25205} Education comprehension: {Education Comprehension:25206}  HOME EXERCISE PROGRAM: ***  GOALS: Goals reviewed with patient? {yes/no:20286}  SHORT TERM GOALS: Target date: ***  *** Baseline: Goal status: INITIAL  2.  *** Baseline:  Goal status: INITIAL  3.  *** Baseline:  Goal status:  INITIAL  4.  *** Baseline:  Goal status: INITIAL  5.  *** Baseline:  Goal status: INITIAL  6.  *** Baseline:  Goal status: INITIAL  LONG TERM GOALS: Target date: ***  *** Baseline:  Goal status: INITIAL  2.  *** Baseline:  Goal status: INITIAL  3.  *** Baseline:  Goal status: INITIAL  4.  *** Baseline:  Goal status: INITIAL  5.  *** Baseline:  Goal status: INITIAL  6.  *** Baseline:  Goal status: INITIAL  ASSESSMENT:  CLINICAL IMPRESSION: Patient is a *** y.o. *** who was seen today for physical therapy evaluation and treatment for ***.   OBJECTIVE IMPAIRMENTS: {opptimpairments:25111}.  ACTIVITY LIMITATIONS: {activitylimitations:27494}  PARTICIPATION LIMITATIONS: {participationrestrictions:25113}  PERSONAL FACTORS: {Personal factors:25162} are also affecting patient's functional outcome.   REHAB POTENTIAL: {rehabpotential:25112}  CLINICAL DECISION MAKING: {clinical decision making:25114}  EVALUATION COMPLEXITY: {Evaluation complexity:25115}  PLAN:  PT FREQUENCY: 2x/week  PT DURATION: 8 weeks  PLANNED INTERVENTIONS: {rehab planned interventions:25118::97110-Therapeutic exercises,97530- Therapeutic [phone]- Neuromuscular re-education,97535- Self Rjmz,02859- Manual therapy,Patient/Family education}  PLAN FOR NEXT SESSION: PIERRETTE Daved KATHEE Ulyses, PT, DPT 04/23/2024, 3:41 PM

## 2024-04-29 ENCOUNTER — Encounter: Payer: Self-pay | Admitting: Physical Therapy

## 2024-04-29 ENCOUNTER — Telehealth: Payer: Self-pay | Admitting: *Deleted

## 2024-04-29 ENCOUNTER — Ambulatory Visit: Admitting: Physical Therapy

## 2024-04-29 DIAGNOSIS — R260 Ataxic gait: Secondary | ICD-10-CM

## 2024-04-29 DIAGNOSIS — R2681 Unsteadiness on feet: Secondary | ICD-10-CM

## 2024-04-29 DIAGNOSIS — R278 Other lack of coordination: Secondary | ICD-10-CM | POA: Diagnosis not present

## 2024-04-29 DIAGNOSIS — R2689 Other abnormalities of gait and mobility: Secondary | ICD-10-CM

## 2024-04-29 DIAGNOSIS — R270 Ataxia, unspecified: Secondary | ICD-10-CM | POA: Diagnosis not present

## 2024-04-29 DIAGNOSIS — R482 Apraxia: Secondary | ICD-10-CM

## 2024-04-29 DIAGNOSIS — R269 Unspecified abnormalities of gait and mobility: Secondary | ICD-10-CM | POA: Diagnosis not present

## 2024-04-29 DIAGNOSIS — Z9889 Other specified postprocedural states: Secondary | ICD-10-CM | POA: Diagnosis not present

## 2024-04-29 NOTE — Patient Instructions (Signed)
 Access Code: [email or web address] URL: [email or web address] Date: 04/29/2024 Prepared by: Daved Bull  Exercises - Standing March with Alternating [facility]  - 1 x daily - 4-5 x weekly - 1-2 sets - 10 reps - Corner Balance Feet Together With Eyes Closed  - 1 x daily - 4-5 x weekly - 1 sets - 2-3 reps - 30-45 seconds hold - Standing Balance with Eyes Closed on Foam  - 1 x daily - 4-5 x weekly - 1 sets - 2-3 reps - 30-45 seconds hold - Step Sideways with Arms Reaching  - 1 x daily - 4-5 x weekly - 1-2 sets - 10 reps - Forward Reach Using Hip Strategy Forward Backward  - 1 x daily - 4-5 x weekly - 1-2 sets - 10 reps - Forward Reach Using Hip Strategy Side to Side  - 1 x daily - 4-5 x weekly - 1-2 sets - 10 reps

## 2024-04-29 NOTE — Telephone Encounter (Signed)
 Pt cds @ the front desk for p/u

## 2024-04-29 NOTE — Therapy (Unsigned)
 OUTPATIENT PHYSICAL THERAPY NEURO TREATMENT   Patient Name: Brandon Heath MRN: [id number] DOB:[birth year], 68 y.o., male Today's Date: 04/30/2024   PCP: Clarice Nottingham, MD REFERRING PROVIDER: Onita Duos, MD  END OF SESSION:  PT End of Session - 04/29/24 1538     Visit Number 2    Number of Visits 17   16 + eval   Date for Recertification  07/04/24   pushed out due to scheduling delays   Authorization Type [employer]    Authorization Time Period requested 10/16    PT Start Time 1534    PT Stop Time 1615    PT Time Calculation (min) 41 min    Equipment Utilized During Treatment Gait belt    Activity Tolerance Patient tolerated treatment well    Behavior During Therapy [facility] for tasks assessed/performed          Past Medical History:  Diagnosis Date   Anxiety    Cervical myelopathy (HCC) 04/16/2019   Depression    Gait disorder 04/16/2019   Past Surgical History:  Procedure Laterality Date   LAMINECTOMY     cervical    TONSILLECTOMY     Patient Active Problem List   Diagnosis Date Noted   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   Hx of excision of lamina of cervical vertebra for decompression of spinal cord 02/05/2024   Colon cancer screening 01/31/2024   Difficulty walking 01/31/2024   Family history of colon cancer 01/31/2024   History of colonic polyps 01/31/2024   Cervical myelopathy (HCC) 04/16/2019   Gait disorder 04/16/2019   Tobacco use 12/21/2011   Anxiety 12/06/2011   Muscle spasm of both lower legs 12/06/2011   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   Night sweats 11/20/2011   Skin mole 11/20/2011    ONSET DATE: 6-12 months ago  REFERRING DIAG: R26.9 (ICD-10-CM) - Gait disorder Z98.890 (ICD-10-CM) - Hx of excision of lamina of cervical vertebra for decompression of spinal cord R27.0 (ICD-10-CM) - Ataxia  THERAPY DIAG:  Ataxic gait  Unsteadiness on feet  Other abnormalities of gait and mobility  Other lack of coordination  Apraxia  Rationale for Evaluation and Treatment:  Rehabilitation  SUBJECTIVE:                                                                                                                                                                                             SUBJECTIVE STATEMENT: Pt denies recent falls since eval.  He reports he has a theory that his walking is better if he takes it like a casual stroll and pays attention to rolling from his heel to his midfoot to prevent inversion.  He is not having pain.   FROM EVAL:  Pt is actively undergoing genetic testing with [facility] in regards to diagnostic process for ongoing symptoms and follows up with [facility].  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  Pt continues to work for [employer] - has motor deficits of hands (L worse).  Has some tingling in digits 1-3 bilaterally but left is new.  On observation he has hyperextension of bilateral fingers.  When just sitting still he feels perfectly normal.  2 years ago he was totally normal and able to move them out of his 2nd floor apt.  He reports the only lasting symptom of his prior SCI from 30-40 years ago is increased bouncing of his RLE worse than his LLE (appears controlled in sitting and returns even after pressure applied through limb).  He endorses that his current symptoms are new and have increased over the last 6-12 months with no known trigger/injury.  He reports he face planted forward with every recent fall and can feel that his drags his toes.  He shows therapist his shoes with beginnings of holes worn into the first inch of his shoes bilaterally - noted on ambulation into clinic as well as significant bilateral genu recurvatum.  He has tried Baclofen because I bounce when I walk.  He feels he can walk more freely with his shoulders rounded but notes they frequently stiffen as he walks pulling him backwards.  He is unsure of number of falls but knows it's more than 6 in the recent 6 months, always forward usually due to  catching his toe.  When he wears dress shoes he has less toe catch in the house.  He provides paper OT referral from [facility] with PT and states one of his MD feels he has more UE deficits - he is aware that finger-to-nose is very hard for him. Pt accompanied by: self (pt drives himself)  PERTINENT HISTORY: Cervical myelopathy s/p laminectomy, anxiety, ataxia, tobacco use, spasticity, muscle spasms  PAIN:  Are you having pain? No  PRECAUTIONS: Fall  RED FLAGS: None   WEIGHT BEARING RESTRICTIONS: No  FALLS: Has patient fallen in last 6 months? Yes. Number of falls >/=6; always falls forward onto hands and knees usually due to toe catch  LIVING ENVIRONMENT: Lives with: lives with their spouse - has 2 cats and small dog Lives in: House/apartment (1st floor apt) Stairs: No Has following equipment at home: Wheelchair (manual)  PLOF: Independent  PATIENT GOALS: walk to my desk or my car and not have it be an adventure  OBJECTIVE:  Note: Objective measures were completed at Evaluation unless otherwise noted.  DIAGNOSTIC FINDINGS:  Brain and cervical MRI from 8/6 IMPRESSION: Unremarkable MRI scan of the brain with and without contrast. IMPRESSION: MRI scan of the cervical spine with and without contrast showing stable changes of posterior decompression from C3-C6 with prominent disc and facet degenerative changes resulting in severe bilateral foraminal narrowing on the right at C3-4, on the left at C4-5 and bilaterally at C7-T1.  No abnormal postcontrast enhancement is noted.  Overall no significant change compared with previous MRI from 01/18/2021.  Thoracic MRI 8/12 IMPRESSION:  Normal MRI thoracic spine (without).  COGNITION: Overall cognitive status: Within functional limits for tasks assessed   SENSATION: Light touch: WFL  COORDINATION: LE RAMS:  mildly slower, but coordinated Heel-to-shin:  WNL bilaterally  EDEMA:  None significant in BLE  MUSCLE TONE: LLE: Modifed  Ashworth Scale 1+ = Slight  increase in muscle tone, manifested by a catch, followed by minimal resistance throughout the remainder (less than half) of the ROM and Bilateral clonus and RLE tone MAS same as LLE  POSTURE: forward head and posterior pelvic tilt - mild forward head  LOWER EXTREMITY ROM:     Active  Right Eval Left Eval  Hip flexion WFL bilaterally  Hip extension   Hip abduction   Hip adduction   Hip internal rotation   Hip external rotation   Knee flexion   Knee extension   Ankle dorsiflexion   Ankle plantarflexion   Ankle inversion    Ankle eversion     (Blank rows = not tested)  LOWER EXTREMITY MMT:    MMT Right Eval Left Eval  Hip flexion 4-/5 4-/5  Hip extension    Hip abduction 4/5 4/5  Hip adduction    Hip internal rotation    Hip external rotation    Knee flexion    Knee extension 5/5 5/5  Ankle dorsiflexion 4/5 4+/5  Ankle plantarflexion    Ankle inversion    Ankle eversion    (Blank rows = not tested)  BED MOBILITY:  Findings: Sit to supine Complete Independence Supine to sit Complete Independence Rolling to Right Complete Independence Rolling to Left Complete Independence  TRANSFERS: Sit to stand: SBA  Assistive device utilized: None     Stand to sit: SBA  Assistive device utilized: None     Chair to chair: SBA and CGA  Assistive device utilized: None       RAMP:  Not tested  CURB:  Not tested  STAIRS: Not tested - pt reports no trouble with stairs that he has noted GAIT: Findings: Gait Characteristics: step to pattern, step through pattern, decreased arm swing- Right, decreased arm swing- Left, decreased step length- Right, decreased step length- Left, decreased stride length, decreased hip/knee flexion- Right, decreased hip/knee flexion- Left, decreased ankle dorsiflexion- Right, decreased ankle dorsiflexion- Left, scissoring, ataxic, decreased trunk rotation, narrow BOS, poor foot clearance- Right, and poor foot clearance- Left,  Distance walked: various clinic distances, Assistive device utilized:None, Level of assistance: SBA and CGA, and Comments: repeated toe catch bilaterally - shoe assessment shoes severely worn toes of shoes  FUNCTIONAL TESTS:  5 times sit to stand: 11.75 sec int BUE support to catch uncontrolled lower - posterior bias resulting in uncontrolled lower and poorly coordinated sequencing Timed up and go (TUG): TBA Berg Balance Scale: TBA   PATIENT SURVEYS:  ABC scale: The Activities-Specific Balance Confidence (ABC) Scale 0% 10 20 30  40 50 60 70 80 90 100% No confidence<->completely confident  "How confident are you that you will not lose your balance or become unsteady when you . . .   Date tested 04/23/2024  Walk around the house 50%  2. Walk up or down stairs 100%  3. Bend over and pick up a slipper from in front of a closet floor 100%  4. Reach for a small can off a shelf at eye level 100%  5. Stand on tip toes and reach for something above your head 100%  6. Stand on a chair and reach for something 100%  7. Sweep the floor 100%  8. Walk outside the house to a car parked in the driveway 0%  9. Get into or out of a car 50%  10. Walk across a parking lot to the mall 0%  11. Walk up or down a ramp 0%  12. Walk in a crowded mall  where people rapidly walk past you 0%  13. Are bumped into by people as you walk through the mall 0%  14. Step onto or off of an escalator while you are holding onto the railing 100%  15. Step onto or off an escalator while holding onto parcels such that you cannot hold onto the railing 50%  16. Walk outside on icy sidewalks 0%  Total: #/16 850/16 = 53.125%                                                                                                                                 TREATMENT DATE: 04/29/2024  -TUG:  13.90 sec no AD CGA -BERG:    [facility] PT Assessment - 04/29/24 1545       Balance   Balance Assessed Yes      Standardized Balance Assessment    Standardized Balance Assessment Berg Balance Test      Berg Balance Test   Sit to Stand Able to stand without using hands and stabilize independently    Standing Unsupported Able to stand safely 2 minutes    Sitting with Back Unsupported but Feet Supported on Floor or Stool Able to sit safely and securely 2 minutes    Stand to Sit Sits safely with minimal use of hands    Transfers Able to transfer safely, minor use of hands    Standing Unsupported with Eyes Closed Able to stand 10 seconds with supervision    Standing Unsupported with Feet Together Able to place feet together independently and stand 1 minute safely    From Standing, Reach Forward with Outstretched Arm Can reach forward >12 cm safely (5)    From Standing Position, Pick up Object from Floor Able to pick up shoe, needs supervision    From Standing Position, Turn to Look Behind Over each Shoulder Looks behind from both sides and weight shifts well    Turn 360 Degrees Needs close supervision or verbal cueing   posterior stepping to regain balance at end of turns   Standing Unsupported, Alternately Place Feet on Step/Stool Able to stand independently and safely and complete 8 steps in 20 seconds    Standing Unsupported, One Foot in [employer] balance while stepping or standing   Significant instability w/ significant LOB over time <20 seconds bilaterally   Standing on One Leg Tries to lift leg/unable to hold 3 seconds but remains standing independently   2.53 seconds on RLE; 4.34 seconds on LLE   Total Score 43    Berg comment: 43/56 = significant fall risk, walker outdoors/cane indoors         Access Code: [email or web address] URL: [email or web address] Date: 04/29/2024 Prepared by: Daved Bull  Exercises - Standing March with Alternating Med [facility]  - 1 x daily - 4-5 x weekly - 1-2 sets - 10 reps - Corner Balance Feet Together With Eyes Closed  - 1 x daily - 4-5 x  weekly - 1 sets - 2-3 reps - 30-45 seconds  hold - Standing Balance with Eyes Closed on Foam  - 1 x daily - 4-5 x weekly - 1 sets - 2-3 reps - 30-45 seconds hold - Step Sideways with Arms Reaching  - 1 x daily - 4-5 x weekly - 1-2 sets - 10 reps - Forward Reach Using Hip Strategy Forward Backward  - 1 x daily - 4-5 x weekly - 1-2 sets - 10 reps - Forward Reach Using Hip Strategy Side to Side  - 1 x daily - 4-5 x weekly - 1-2 sets - 10 reps  PATIENT EDUCATION: Education details: Initial HEP, balance strategies and deficits noted today. Person educated: Patient Education method: Explanation, Demonstration, Verbal cues, and Handouts Education comprehension: verbalized understanding and needs further education  HOME EXERCISE PROGRAM: Access Code: [email or web address] URL: [email or web address] Date: 04/29/2024 Prepared by: Daved Bull  Exercises - Standing March with Alternating Med [facility]  - 1 x daily - 4-5 x weekly - 1-2 sets - 10 reps - Corner Balance Feet Together With Eyes Closed  - 1 x daily - 4-5 x weekly - 1 sets - 2-3 reps - 30-45 seconds hold - Standing Balance with Eyes Closed on Foam  - 1 x daily - 4-5 x weekly - 1 sets - 2-3 reps - 30-45 seconds hold - Step Sideways with Arms Reaching  - 1 x daily - 4-5 x weekly - 1-2 sets - 10 reps - Forward Reach Using Hip Strategy Forward Backward  - 1 x daily - 4-5 x weekly - 1-2 sets - 10 reps - Forward Reach Using Hip Strategy Side to Side  - 1 x daily - 4-5 x weekly - 1-2 sets - 10 reps  GOALS: Goals reviewed with patient? Yes  SHORT TERM GOALS: Target date: 05/23/2024  Pt will be independent and compliant with introductory HEP in order to maintain functional progress and improve mobility. Baseline:  To be established. Goal status: INITIAL  2.  Pt will maintain 5xSTS timing but demonstrate improved mechanics for safety in order to demonstrate decreased risk for falls and improved functional bilateral LE strength and power. Baseline: 11.75 seconds w/ intermittent  BUE support - posterior bias Goal status: INITIAL  3.  TUG to be assessed w/ STG set as appropriate. Baseline: 13.90 sec no AD CGA - LTG only (10/21) Goal status: MET   4.  Pt will increase BERG balance score to >/=48/56 to demonstrate improved static balance. Baseline: 43/56 (10/21) Goal status: INITIAL  LONG TERM GOALS: Target date: 06/20/2024  Pt will be independent and compliant with advanced and finalized balance focused HEP in order to maintain functional progress and improve mobility. Baseline: To be established. Goal status: INITIAL  2.  Pt will improve ABC Scale score to >/= 63.125% in order to demonstrate decreased risk of falling and improved confidence in high level mobility. Baseline: 53.125% Goal status: INITIAL  3.  Pt will demonstrate TUG of </=12 seconds in order to decrease risk of falls and improve functional mobility using LRAD. Baseline: 13.90 sec no AD CGA (10/21) Goal status: INITIAL  4.  Pt will increase BERG balance score to >/=53/56 to demonstrate improved static balance. Baseline: 43/56 (10/21) Goal status: INITIAL  5.  Pt to ambulate w/ most appropriate AD option >/=150 ft without significant LOB and no more than SBA in order to improve ambulatory safety and tolerance. Baseline: Effortful gait w/ severe bilateral toe drag and knee hyperextension w/o AD/bracing  Goal status: INITIAL  6.  Most appropriate bracing/toe cap combination to be assessed and order to be requested as appropriate. Baseline: Discussed options on eval. Goal status: INITIAL  ASSESSMENT:  CLINICAL IMPRESSION: Emphasis of skilled session on assessing metrics not captured on eval.  He is at an elevated fall risk based on BERG score of 43/56.  His TUG time is only mildly abnormal at 13.90 seconds.  Established introductory HEP to address these deficits and improve vestibular input and ankle strategy.  He continues to benefit from skilled PT to optimize motor control and safest upright  mobility.  Continue per POC.  OBJECTIVE IMPAIRMENTS: Abnormal gait, decreased activity tolerance, decreased balance, decreased coordination, decreased endurance, decreased knowledge of condition, decreased knowledge of use of DME, difficulty walking, impaired perceived functional ability, impaired tone, impaired UE functional use, impaired vision/preception, and improper body mechanics.   ACTIVITY LIMITATIONS: carrying, lifting, bending, standing, squatting, stairs, transfers, reach over head, and locomotion level  PARTICIPATION LIMITATIONS: meal prep, cleaning, interpersonal relationship, community activity, occupation, and yard work  PERSONAL FACTORS: Age, Past/current experiences, Time since onset of injury/illness/exacerbation, and 1-2 comorbidities: ongoing diagnostic process, prior SCI/cervical myelopathy w/ resultant spasticity/ataxia are also affecting patient's functional outcome.   REHAB POTENTIAL: Good  CLINICAL DECISION MAKING: Unstable/unpredictable  EVALUATION COMPLEXITY: High  PLAN:  PT FREQUENCY: 2x/week  PT DURATION: 8 weeks  PLANNED INTERVENTIONS: [id number]- PT Re-evaluation, 97750- Physical Performance Testing, 97110-Therapeutic exercises, 97530- Therapeutic activity, [id number]- Neuromuscular re-education, 97535- Self Care, [id number]- Manual therapy, [id number]- Gait training, [phone]- Orthotic Initial, [phone]- Orthotic/Prosthetic subsequent, [phone]- Aquatic Therapy, [phone]- Electrical stimulation (manual), Patient/Family education, Balance training, Stair training, Taping, Vestibular training, and DME instructions  PLAN FOR NEXT SESSION: Expand HEP - BLE coordination, strength and balance.  Assess AFO/toe cap needs and possible cane vs rollator use.  Work on Environmental manager - need to work on outdoors/unlevel/obstacles.  Blaze pods, coordination - toe taps, step up knee drives; hamstring strength   Daved KATHEE Bull, PT, DPT 04/30/2024, 7:54 AM

## 2024-04-30 ENCOUNTER — Encounter: Payer: Self-pay | Admitting: Physical Therapy

## 2024-04-30 ENCOUNTER — Ambulatory Visit: Admitting: Physical Therapy

## 2024-04-30 DIAGNOSIS — R2689 Other abnormalities of gait and mobility: Secondary | ICD-10-CM | POA: Diagnosis not present

## 2024-04-30 DIAGNOSIS — R482 Apraxia: Secondary | ICD-10-CM

## 2024-04-30 DIAGNOSIS — R2681 Unsteadiness on feet: Secondary | ICD-10-CM

## 2024-04-30 DIAGNOSIS — R270 Ataxia, unspecified: Secondary | ICD-10-CM | POA: Diagnosis not present

## 2024-04-30 DIAGNOSIS — R278 Other lack of coordination: Secondary | ICD-10-CM | POA: Diagnosis not present

## 2024-04-30 DIAGNOSIS — Z9889 Other specified postprocedural states: Secondary | ICD-10-CM | POA: Diagnosis not present

## 2024-04-30 DIAGNOSIS — R260 Ataxic gait: Secondary | ICD-10-CM

## 2024-04-30 DIAGNOSIS — R269 Unspecified abnormalities of gait and mobility: Secondary | ICD-10-CM | POA: Diagnosis not present

## 2024-04-30 NOTE — Therapy (Signed)
 OUTPATIENT PHYSICAL THERAPY NEURO TREATMENT   Patient Name: Brandon Heath MRN: [id number] DOB:05-02-1956, 68 y.o., male Today's Date: 04/30/2024   PCP: Clarice Nottingham, MD REFERRING PROVIDER: Onita Duos, MD  END OF SESSION:  PT End of Session - 04/30/24 1114     Visit Number 3    Number of Visits 17   16 + eval   Date for Recertification  07/04/24   pushed out due to scheduling delays   Authorization Type [employer]    Authorization Time Period requested 10/16    PT Start Time 1108    PT Stop Time 1148    PT Time Calculation (min) 40 min    Equipment Utilized During Treatment Gait belt    Activity Tolerance Patient tolerated treatment well    Behavior During Therapy [facility] for tasks assessed/performed          Past Medical History:  Diagnosis Date   Anxiety    Cervical myelopathy (HCC) 04/16/2019   Depression    Gait disorder 04/16/2019   Past Surgical History:  Procedure Laterality Date   LAMINECTOMY     cervical    TONSILLECTOMY     Patient Active Problem List   Diagnosis Date Noted   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   Hx of excision of lamina of cervical vertebra for decompression of spinal cord 02/05/2024   Colon cancer screening 01/31/2024   Difficulty walking 01/31/2024   Family history of colon cancer 01/31/2024   History of colonic polyps 01/31/2024   Cervical myelopathy (HCC) 04/16/2019   Gait disorder 04/16/2019   Tobacco use 12/21/2011   Anxiety 12/06/2011   Muscle spasm of both lower legs 12/06/2011   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   Night sweats 11/20/2011   Skin mole 11/20/2011    ONSET DATE: 6-12 months ago  REFERRING DIAG: R26.9 (ICD-10-CM) - Gait disorder Z98.890 (ICD-10-CM) - Hx of excision of lamina of cervical vertebra for decompression of spinal cord R27.0 (ICD-10-CM) - Ataxia  THERAPY DIAG:  Ataxic gait  Unsteadiness on feet  Other abnormalities of gait and mobility  Other lack of coordination  Apraxia  Rationale for Evaluation and Treatment:  Rehabilitation  SUBJECTIVE:                                                                                                                                                                                             SUBJECTIVE STATEMENT: Pt denies recent falls since yesterday.  He states he thought his appt was at 1pm.  He is not having pain.   FROM EVAL:  Pt is actively undergoing genetic testing with [facility] in regards to diagnostic process for  ongoing symptoms and follows up with [facility].  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  Pt continues to work for [employer] - has motor deficits of hands (L worse).  Has some tingling in digits 1-3 bilaterally but left is new.  On observation he has hyperextension of bilateral fingers.  When just sitting still he feels perfectly normal.  2 years ago he was totally normal and able to move them out of his 2nd floor apt.  He reports the only lasting symptom of his prior SCI from 30-40 years ago is increased bouncing of his RLE worse than his LLE (appears controlled in sitting and returns even after pressure applied through limb).  He endorses that his current symptoms are new and have increased over the last 6-12 months with no known trigger/injury.  He reports he face planted forward with every recent fall and can feel that his drags his toes.  He shows therapist his shoes with beginnings of holes worn into the first inch of his shoes bilaterally - noted on ambulation into clinic as well as significant bilateral genu recurvatum.  He has tried Baclofen because I bounce when I walk.  He feels he can walk more freely with his shoulders rounded but notes they frequently stiffen as he walks pulling him backwards.  He is unsure of number of falls but knows it's more than 6 in the recent 6 months, always forward usually due to catching his toe.  When he wears dress shoes he has less toe catch in the house.  He provides paper OT referral from [facility] with PT  and states one of his MD feels he has more UE deficits - he is aware that finger-to-nose is very hard for him. Pt accompanied by: self (pt drives himself)  PERTINENT HISTORY: Cervical myelopathy s/p laminectomy, anxiety, ataxia, tobacco use, spasticity, muscle spasms  PAIN:  Are you having pain? No  PRECAUTIONS: Fall  RED FLAGS: None   WEIGHT BEARING RESTRICTIONS: No  FALLS: Has patient fallen in last 6 months? Yes. Number of falls >/=6; always falls forward onto hands and knees usually due to toe catch  LIVING ENVIRONMENT: Lives with: lives with their spouse - has 2 cats and small dog Lives in: House/apartment (1st floor apt) Stairs: No Has following equipment at home: Wheelchair (manual)  PLOF: Independent  PATIENT GOALS: walk to my desk or my car and not have it be an adventure  OBJECTIVE:  Note: Objective measures were completed at Evaluation unless otherwise noted.  DIAGNOSTIC FINDINGS:  Brain and cervical MRI from 8/6 IMPRESSION: Unremarkable MRI scan of the brain with and without contrast. IMPRESSION: MRI scan of the cervical spine with and without contrast showing stable changes of posterior decompression from C3-C6 with prominent disc and facet degenerative changes resulting in severe bilateral foraminal narrowing on the right at C3-4, on the left at C4-5 and bilaterally at C7-T1.  No abnormal postcontrast enhancement is noted.  Overall no significant change compared with previous MRI from 01/18/2021.  Thoracic MRI 8/12 IMPRESSION:  Normal MRI thoracic spine (without).  COGNITION: Overall cognitive status: Within functional limits for tasks assessed   SENSATION: Light touch: WFL  COORDINATION: LE RAMS:  mildly slower, but coordinated Heel-to-shin:  WNL bilaterally  EDEMA:  None significant in BLE  MUSCLE TONE: LLE: Modifed Ashworth Scale 1+ = Slight increase in muscle tone, manifested by a catch, followed by minimal resistance throughout the remainder  (less than half) of the ROM and Bilateral clonus  and RLE tone MAS same as LLE  POSTURE: forward head and posterior pelvic tilt - mild forward head  LOWER EXTREMITY ROM:     Active  Right Eval Left Eval  Hip flexion WFL bilaterally  Hip extension   Hip abduction   Hip adduction   Hip internal rotation   Hip external rotation   Knee flexion   Knee extension   Ankle dorsiflexion   Ankle plantarflexion   Ankle inversion    Ankle eversion     (Blank rows = not tested)  LOWER EXTREMITY MMT:    MMT Right Eval Left Eval  Hip flexion 4-/5 4-/5  Hip extension    Hip abduction 4/5 4/5  Hip adduction    Hip internal rotation    Hip external rotation    Knee flexion    Knee extension 5/5 5/5  Ankle dorsiflexion 4/5 4+/5  Ankle plantarflexion    Ankle inversion    Ankle eversion    (Blank rows = not tested)  BED MOBILITY:  Findings: Sit to supine Complete Independence Supine to sit Complete Independence Rolling to Right Complete Independence Rolling to Left Complete Independence  TRANSFERS: Sit to stand: SBA  Assistive device utilized: None     Stand to sit: SBA  Assistive device utilized: None     Chair to chair: SBA and CGA  Assistive device utilized: None       RAMP:  Not tested  CURB:  Not tested  STAIRS: Not tested - pt reports no trouble with stairs that he has noted GAIT: Findings: Gait Characteristics: step to pattern, step through pattern, decreased arm swing- Right, decreased arm swing- Left, decreased step length- Right, decreased step length- Left, decreased stride length, decreased hip/knee flexion- Right, decreased hip/knee flexion- Left, decreased ankle dorsiflexion- Right, decreased ankle dorsiflexion- Left, scissoring, ataxic, decreased trunk rotation, narrow BOS, poor foot clearance- Right, and poor foot clearance- Left, Distance walked: various clinic distances, Assistive device utilized:None, Level of assistance: SBA and CGA, and Comments: repeated  toe catch bilaterally - shoe assessment shoes severely worn toes of shoes  FUNCTIONAL TESTS:  5 times sit to stand: 11.75 sec int BUE support to catch uncontrolled lower - posterior bias resulting in uncontrolled lower and poorly coordinated sequencing Timed up and go (TUG): TBA Berg Balance Scale: TBA   PATIENT SURVEYS:  ABC scale: The Activities-Specific Balance Confidence (ABC) Scale 0% 10 20 30  40 50 60 70 80 90 100% No confidence<->completely confident  "How confident are you that you will not lose your balance or become unsteady when you . . .   Date tested 04/23/2024  Walk around the house 50%  2. Walk up or down stairs 100%  3. Bend over and pick up a slipper from in front of a closet floor 100%  4. Reach for a small can off a shelf at eye level 100%  5. Stand on tip toes and reach for something above your head 100%  6. Stand on a chair and reach for something 100%  7. Sweep the floor 100%  8. Walk outside the house to a car parked in the driveway 0%  9. Get into or out of a car 50%  10. Walk across a parking lot to the mall 0%  11. Walk up or down a ramp 0%  12. Walk in a crowded mall where people rapidly walk past you 0%  13. Are bumped into by people as you walk through the mall 0%  14. Step  onto or off of an escalator while you are holding onto the railing 100%  15. Step onto or off an escalator while holding onto parcels such that you cannot hold onto the railing 50%  16. Walk outside on icy sidewalks 0%  Total: #/16 850/16 = 53.125%                                                                                                                                 TREATMENT DATE: 04/30/2024  -Assessed bilateral toe caps x345 ft w/ much improved stride and swing phase.  He has moderately uncontrolled forward momentum on last 100 ft of distance requiring CGA for improved stability, also noted R in toeing for last portion of distance.  When toe caps removed pt has immediate R  toe catch during swing.   -SciFit hill mode up to level 4.0 x 8 minutes for neural priming and large amplitude reciprocal mobility - pt endorses no LE fatigue but great fatigue in arms (my arms might fall off). 6 Blaze pods on random one color taps setting for improved reaction time and LE coordination.  Performed on 1 minute intervals with 30 second rest periods.  Pt requires no guarding. Round 1:  Seated semicircular floor pod setup.  50 hits. Round 2:   setup.  50 hits. Round 3:   setup.  48 hits. Notable errors/deficits:  Noted clonus bilaterally during task. 6 Blaze pods on random one color taps setting for improved reaction time and LE coordination.  Performed on 1 minute intervals with 30 second rest periods.  Pt requires CGA guarding. Round 1:  Standing semicircular floor pod setup.  35 hits. Round 2:   setup.  38 hits. Round 3:   setup.  36 hits. Notable errors/deficits:  Noted less clonus in standing than sitting bilaterally during task.  Several LOB w/ stepping to correct.  PATIENT EDUCATION: Education details: Attempt HEP when able.  Will assess AFO next visit and possible combo to request order as needed. Person educated: Patient Education method: Explanation, Demonstration, Verbal cues, and Handouts Education comprehension: verbalized understanding and needs further education  HOME EXERCISE PROGRAM: Access Code: [email or web address] URL: [email or web address] Date: 04/29/2024 Prepared by: Daved Bull  Exercises - Standing March with Alternating Med [facility]  - 1 x daily - 4-5 x weekly - 1-2 sets - 10 reps - Corner Balance Feet Together With Eyes Closed  - 1 x daily - 4-5 x weekly - 1 sets - 2-3 reps - 30-45 seconds hold - Standing Balance with Eyes Closed on Foam  - 1 x daily - 4-5 x weekly - 1 sets - 2-3 reps - 30-45 seconds hold - Step Sideways with Arms Reaching  - 1 x daily - 4-5 x weekly - 1-2 sets - 10 reps - Forward Reach Using Hip Strategy Forward  Backward  - 1 x daily - 4-5 x weekly - 1-2 sets - 10 reps -  Forward Reach Using Hip Strategy Side to Side  - 1 x daily - 4-5 x weekly - 1-2 sets - 10 reps  GOALS: Goals reviewed with patient? Yes  SHORT TERM GOALS: Target date: 05/23/2024  Pt will be independent and compliant with introductory HEP in order to maintain functional progress and improve mobility. Baseline:  To be established. Goal status: INITIAL  2.  Pt will maintain 5xSTS timing but demonstrate improved mechanics for safety in order to demonstrate decreased risk for falls and improved functional bilateral LE strength and power. Baseline: 11.75 seconds w/ intermittent BUE support - posterior bias Goal status: INITIAL  3.  TUG to be assessed w/ STG set as appropriate. Baseline: 13.90 sec no AD CGA - LTG only (10/21) Goal status: MET   4.  Pt will increase BERG balance score to >/=48/56 to demonstrate improved static balance. Baseline: 43/56 (10/21) Goal status: INITIAL  LONG TERM GOALS: Target date: 06/20/2024  Pt will be independent and compliant with advanced and finalized balance focused HEP in order to maintain functional progress and improve mobility. Baseline: To be established. Goal status: INITIAL  2.  Pt will improve ABC Scale score to >/= 63.125% in order to demonstrate decreased risk of falling and improved confidence in high level mobility. Baseline: 53.125% Goal status: INITIAL  3.  Pt will demonstrate TUG of </=12 seconds in order to decrease risk of falls and improve functional mobility using LRAD. Baseline: 13.90 sec no AD CGA (10/21) Goal status: INITIAL  4.  Pt will increase BERG balance score to >/=53/56 to demonstrate improved static balance. Baseline: 43/56 (10/21) Goal status: INITIAL  5.  Pt to ambulate w/ most appropriate AD option >/=150 ft without significant LOB and no more than SBA in order to improve ambulatory safety and tolerance. Baseline: Effortful gait w/ severe bilateral toe  drag and knee hyperextension w/o AD/bracing Goal status: INITIAL  6.  Most appropriate bracing/toe cap combination to be assessed and order to be requested as appropriate. Baseline: Discussed options on eval. Goal status: INITIAL  ASSESSMENT:  CLINICAL IMPRESSION: Assessed toe caps bilaterally today with pt likely to benefit from these and is agreeable to PT pursuing order.  Will assess AFO benefit prior to requesting order.  He demonstrates improved coordination and reaction time with blaze pods tasks today.  He did have more clonus in sitting variation.  He stands to benefit from skilled PT in this setting to improve upright mobility and safety with ambulatory tasks.  Continue per POC.  OBJECTIVE IMPAIRMENTS: Abnormal gait, decreased activity tolerance, decreased balance, decreased coordination, decreased endurance, decreased knowledge of condition, decreased knowledge of use of DME, difficulty walking, impaired perceived functional ability, impaired tone, impaired UE functional use, impaired vision/preception, and improper body mechanics.   ACTIVITY LIMITATIONS: carrying, lifting, bending, standing, squatting, stairs, transfers, reach over head, and locomotion level  PARTICIPATION LIMITATIONS: meal prep, cleaning, interpersonal relationship, community activity, occupation, and yard work  PERSONAL FACTORS: Age, Past/current experiences, Time since onset of injury/illness/exacerbation, and 1-2 comorbidities: ongoing diagnostic process, prior SCI/cervical myelopathy w/ resultant spasticity/ataxia are also affecting patient's functional outcome.   REHAB POTENTIAL: Good  CLINICAL DECISION MAKING: Unstable/unpredictable  EVALUATION COMPLEXITY: High  PLAN:  PT FREQUENCY: 2x/week  PT DURATION: 8 weeks  PLANNED INTERVENTIONS: [id number]- PT Re-evaluation, 97750- Physical Performance Testing, 97110-Therapeutic exercises, 97530- Therapeutic activity, [id number]- Neuromuscular re-education, 97535- Self  Care, [id number]- Manual therapy, [id number]- Gait training, [id number]- Orthotic Initial, [id number]- Orthotic/Prosthetic subsequent, [phone]- Aquatic Therapy, [phone]- Electrical stimulation (manual), Patient/Family education,  Balance training, Stair training, Taping, Vestibular training, and DME instructions  PLAN FOR NEXT SESSION: Expand HEP - BLE coordination, strength and balance.  Assess  possible cane vs rollator use.  Work on Environmental manager - need to work on outdoors/unlevel/obstacles.  Blaze pods- assign color to LE/alternating LE and UE/varied surfaces, coordination - toe taps, step up knee drives; hamstring strength; request order for toe caps when AFO assessed!   Daved KATHEE Bull, PT, DPT 04/30/2024, 11:50 AM

## 2024-05-06 ENCOUNTER — Ambulatory Visit
Admission: RE | Admit: 2024-05-06 | Discharge: 2024-05-06 | Disposition: A | Discharge status: Home / Self Care | Source: Ambulatory Visit | Attending: Internal Medicine | Admitting: Internal Medicine

## 2024-05-06 ENCOUNTER — Ambulatory Visit: Admitting: Physical Therapy

## 2024-05-06 DIAGNOSIS — F17210 Nicotine dependence, cigarettes, uncomplicated: Secondary | ICD-10-CM | POA: Diagnosis not present

## 2024-05-06 DIAGNOSIS — Z87891 Personal history of nicotine dependence: Secondary | ICD-10-CM

## 2024-05-06 DIAGNOSIS — Z122 Encounter for screening for malignant neoplasm of respiratory organs: Secondary | ICD-10-CM

## 2024-05-09 ENCOUNTER — Ambulatory Visit: Admitting: Physical Therapy

## 2024-05-09 ENCOUNTER — Encounter: Payer: Self-pay | Admitting: Physical Therapy

## 2024-05-09 DIAGNOSIS — R278 Other lack of coordination: Secondary | ICD-10-CM | POA: Diagnosis not present

## 2024-05-09 DIAGNOSIS — R482 Apraxia: Secondary | ICD-10-CM | POA: Diagnosis not present

## 2024-05-09 DIAGNOSIS — R260 Ataxic gait: Secondary | ICD-10-CM | POA: Diagnosis not present

## 2024-05-09 DIAGNOSIS — R270 Ataxia, unspecified: Secondary | ICD-10-CM | POA: Diagnosis not present

## 2024-05-09 DIAGNOSIS — Z9889 Other specified postprocedural states: Secondary | ICD-10-CM | POA: Diagnosis not present

## 2024-05-09 DIAGNOSIS — R2681 Unsteadiness on feet: Secondary | ICD-10-CM

## 2024-05-09 DIAGNOSIS — R2689 Other abnormalities of gait and mobility: Secondary | ICD-10-CM

## 2024-05-09 DIAGNOSIS — R269 Unspecified abnormalities of gait and mobility: Secondary | ICD-10-CM | POA: Diagnosis not present

## 2024-05-09 NOTE — Therapy (Signed)
 OUTPATIENT PHYSICAL THERAPY NEURO TREATMENT   Patient Name: Brandon Heath MRN: [id number] DOB:[birth year], 68 y.o., male Today's Date: 05/09/2024   PCP: Clarice Nottingham, MD REFERRING PROVIDER: Onita Duos, MD  END OF SESSION:  PT End of Session - 05/09/24 1018     Visit Number 4    Number of Visits 17   16 + eval   Date for Recertification  07/04/24   pushed out due to scheduling delays   Authorization Type [employer]    Authorization Time Period requested 10/16    PT Start Time 1017    PT Stop Time 1100    PT Time Calculation (min) 43 min    Equipment Utilized During Treatment Gait belt    Activity Tolerance Patient tolerated treatment well    Behavior During Therapy WFL for tasks assessed/performed          Past Medical History:  Diagnosis Date   Anxiety    Cervical myelopathy (HCC) 04/16/2019   Depression    Gait disorder 04/16/2019   Past Surgical History:  Procedure Laterality Date   LAMINECTOMY     cervical    TONSILLECTOMY     Patient Active Problem List   Diagnosis Date Noted   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   Hx of excision of lamina of cervical vertebra for decompression of spinal cord 02/05/2024   Colon cancer screening 01/31/2024   Difficulty walking 01/31/2024   Family history of colon cancer 01/31/2024   History of colonic polyps 01/31/2024   Cervical myelopathy (HCC) 04/16/2019   Gait disorder 04/16/2019   Tobacco use 12/21/2011   Anxiety 12/06/2011   Muscle spasm of both lower legs 12/06/2011   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   Night sweats 11/20/2011   Skin mole 11/20/2011    ONSET DATE: 6-12 months ago  REFERRING DIAG: R26.9 (ICD-10-CM) - Gait disorder Z98.890 (ICD-10-CM) - Hx of excision of lamina of cervical vertebra for decompression of spinal cord R27.0 (ICD-10-CM) - Ataxia  THERAPY DIAG:  Ataxic gait  Unsteadiness on feet  Other abnormalities of gait and mobility  Other lack of coordination  Apraxia  Rationale for Evaluation and Treatment:  Rehabilitation  SUBJECTIVE:                                                                                                                                                                                             SUBJECTIVE STATEMENT: Pt denies recent falls since visit prior though his walking has been worse the last 2 days.  He reports feeling there is no hope of his ataxia getting better but he is open to modifying some things to preserve his  function.  He is not having pain.  He is planning to mail CD of his MRIs to [facility] for second opinion.   FROM EVAL:  Pt is actively undergoing genetic testing with [facility] in regards to diagnostic process for ongoing symptoms and follows up with [facility].  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  Pt continues to work for [employer] - has motor deficits of hands (L worse).  Has some tingling in digits 1-3 bilaterally but left is new.  On observation he has hyperextension of bilateral fingers.  When just sitting still he feels perfectly normal.  2 years ago he was totally normal and able to move them out of his 2nd floor apt.  He reports the only lasting symptom of his prior SCI from 30-40 years ago is increased bouncing of his RLE worse than his LLE (appears controlled in sitting and returns even after pressure applied through limb).  He endorses that his current symptoms are new and have increased over the last 6-12 months with no known trigger/injury.  He reports he face planted forward with every recent fall and can feel that his drags his toes.  He shows therapist his shoes with beginnings of holes worn into the first inch of his shoes bilaterally - noted on ambulation into clinic as well as significant bilateral genu recurvatum.  He has tried Baclofen because I bounce when I walk.  He feels he can walk more freely with his shoulders rounded but notes they frequently stiffen as he walks pulling him backwards.  He is unsure of number of falls  but knows it's more than 6 in the recent 6 months, always forward usually due to catching his toe.  When he wears dress shoes he has less toe catch in the house.  He provides paper OT referral from [facility] with PT and states one of his MD feels he has more UE deficits - he is aware that finger-to-nose is very hard for him. Pt accompanied by: self (pt drives himself)  PERTINENT HISTORY: Cervical myelopathy s/p laminectomy, anxiety, ataxia, tobacco use, spasticity, muscle spasms  PAIN:  Are you having pain? No  PRECAUTIONS: Fall  RED FLAGS: None   WEIGHT BEARING RESTRICTIONS: No  FALLS: Has patient fallen in last 6 months? Yes. Number of falls >/=6; always falls forward onto hands and knees usually due to toe catch  LIVING ENVIRONMENT: Lives with: lives with their spouse - has 2 cats and small dog Lives in: House/apartment (1st floor apt) Stairs: No Has following equipment at home: Wheelchair (manual)  PLOF: Independent  PATIENT GOALS: walk to my desk or my car and not have it be an adventure  OBJECTIVE:  Note: Objective measures were completed at Evaluation unless otherwise noted.  DIAGNOSTIC FINDINGS:  Brain and cervical MRI from 8/6 IMPRESSION: Unremarkable MRI scan of the brain with and without contrast. IMPRESSION: MRI scan of the cervical spine with and without contrast showing stable changes of posterior decompression from C3-C6 with prominent disc and facet degenerative changes resulting in severe bilateral foraminal narrowing on the right at C3-4, on the left at C4-5 and bilaterally at C7-T1.  No abnormal postcontrast enhancement is noted.  Overall no significant change compared with previous MRI from 01/18/2021.  Thoracic MRI 8/12 IMPRESSION:  Normal MRI thoracic spine (without).  COGNITION: Overall cognitive status: Within functional limits for tasks assessed   SENSATION: Light touch: WFL  COORDINATION: LE RAMS:  mildly slower, but coordinated Heel-to-shin:  WNL  bilaterally  EDEMA:  None significant in BLE  MUSCLE TONE: LLE: Modifed Ashworth Scale 1+ = Slight increase in muscle tone, manifested by a catch, followed by minimal resistance throughout the remainder (less than half) of the ROM and Bilateral clonus and RLE tone MAS same as LLE  POSTURE: forward head and posterior pelvic tilt - mild forward head  LOWER EXTREMITY ROM:     Active  Right Eval Left Eval  Hip flexion WFL bilaterally  Hip extension   Hip abduction   Hip adduction   Hip internal rotation   Hip external rotation   Knee flexion   Knee extension   Ankle dorsiflexion   Ankle plantarflexion   Ankle inversion    Ankle eversion     (Blank rows = not tested)  LOWER EXTREMITY MMT:    MMT Right Eval Left Eval  Hip flexion 4-/5 4-/5  Hip extension    Hip abduction 4/5 4/5  Hip adduction    Hip internal rotation    Hip external rotation    Knee flexion    Knee extension 5/5 5/5  Ankle dorsiflexion 4/5 4+/5  Ankle plantarflexion    Ankle inversion    Ankle eversion    (Blank rows = not tested)  BED MOBILITY:  Findings: Sit to supine Complete Independence Supine to sit Complete Independence Rolling to Right Complete Independence Rolling to Left Complete Independence  TRANSFERS: Sit to stand: SBA  Assistive device utilized: None     Stand to sit: SBA  Assistive device utilized: None     Chair to chair: SBA and CGA  Assistive device utilized: None       RAMP:  Not tested  CURB:  Not tested  STAIRS: Not tested - pt reports no trouble with stairs that he has noted GAIT: Findings: Gait Characteristics: step to pattern, step through pattern, decreased arm swing- Right, decreased arm swing- Left, decreased step length- Right, decreased step length- Left, decreased stride length, decreased hip/knee flexion- Right, decreased hip/knee flexion- Left, decreased ankle dorsiflexion- Right, decreased ankle dorsiflexion- Left, scissoring, ataxic, decreased trunk  rotation, narrow BOS, poor foot clearance- Right, and poor foot clearance- Left, Distance walked: various clinic distances, Assistive device utilized:None, Level of assistance: SBA and CGA, and Comments: repeated toe catch bilaterally - shoe assessment shoes severely worn toes of shoes  FUNCTIONAL TESTS:  5 times sit to stand: 11.75 sec int BUE support to catch uncontrolled lower - posterior bias resulting in uncontrolled lower and poorly coordinated sequencing Timed up and go (TUG): TBA Berg Balance Scale: TBA   PATIENT SURVEYS:  ABC scale: The Activities-Specific Balance Confidence (ABC) Scale 0% 10 20 30  40 50 60 70 80 90 100% No confidence<->completely confident  "How confident are you that you will not lose your balance or become unsteady when you . . .   Date tested 04/23/2024  Walk around the house 50%  2. Walk up or down stairs 100%  3. Bend over and pick up a slipper from in front of a closet floor 100%  4. Reach for a small can off a shelf at eye level 100%  5. Stand on tip toes and reach for something above your head 100%  6. Stand on a chair and reach for something 100%  7. Sweep the floor 100%  8. Walk outside the house to a car parked in the driveway 0%  9. Get into or out of a car 50%  10. Walk across a parking lot to the mall 0%  11. Walk up or down a ramp 0%  12. Walk in a crowded mall where people rapidly walk past you 0%  13. Are bumped into by people as you walk through the mall 0%  14. Step onto or off of an escalator while you are holding onto the railing 100%  15. Step onto or off an escalator while holding onto parcels such that you cannot hold onto the railing 50%  16. Walk outside on icy sidewalks 0%  Total: #/16 850/16 = 53.125%                                                                                                                                 TREATMENT DATE: 05/09/2024  -Therapeutic use of self regarding fear of symptom progression - offered  strong minds resource and request for counseling referral from PCP/neurologist - pt politely declines as he has strong family support; discussed monitoring symptom progression if any and reporting when noted, also discussed modifying what PT cannot fix to maintain functionality and pt reports this gives him some hope  GAIT: Gait pattern: step to pattern, step through pattern, decreased arm swing- Right, decreased arm swing- Left, decreased step length- Right, decreased step length- Left, decreased stance time- Right, decreased stance time- Left, decreased stride length, scissoring, ataxic, narrow BOS, poor foot clearance- Right, and poor foot clearance- Left Distance walked: 7 x 115' (level indoor surfaces) Assistive device utilized: Counselling psychologist, Environmental Consultant - 4 wheeled, standalone cane, and None Level of assistance: CGA Comments: Pt appears to be toe walking more today overall.  His spasticity appears worse as he is bouncing more with each step and has more toe drag.  Trialed Bilateral Thuasne AFOs to help with knee control and spasticity as he would likely have more spasticity w/ PLS AFO - AFOs did not well control hyperextension and pt has prominent foot scuff even when trialed w/ toe caps today.  He appears to develop some right hip torsion in stance w/ AFOs donned.  Retrialed toe caps alone w/ better mechanics of swing though pt still has difficulty w/ momentum and slowing down.  Less scissoring than w/ AFOs.    Trialed toe cap and AFOs w/ rollator w/ pt having difficulty controlling momentum > w/ standalone and SBQC pt demonstrates difficulty sequencing often dragging cane around vs utilizing for stability, even when cued for placement no significant stability gained and still uncontrolled momentum at times.  -Single forward LOB w/ minA and wall to recover on ambulation out of gym; discussed 2WW and pt does not necessarily want this device for personal reasons, but is open to trying this.   Is open to toe caps but would like to compare to dress shoes first.  PATIENT EDUCATION: Education details: Attempt HEP when able.  Toe cap vs dress shoes - bring dress shoes to next session.  AD options - will assess 2WW in future.  AFO - may  revisit in future, but appeared to worsen spasticity today.  Discuss spasticity management/medications w/ neurologist - he has tried Baclofen and had crazy dreams and never filled the other recommended medication. Person educated: Patient Education method: Explanation, Demonstration, Verbal cues, and Handouts Education comprehension: verbalized understanding and needs further education  HOME EXERCISE PROGRAM: Access Code: [email or web address] URL: [email or web address] Date: 04/29/2024 Prepared by: Daved Bull  Exercises - Standing March with Alternating Med [facility]  - 1 x daily - 4-5 x weekly - 1-2 sets - 10 reps - Corner Balance Feet Together With Eyes Closed  - 1 x daily - 4-5 x weekly - 1 sets - 2-3 reps - 30-45 seconds hold - Standing Balance with Eyes Closed on Foam  - 1 x daily - 4-5 x weekly - 1 sets - 2-3 reps - 30-45 seconds hold - Step Sideways with Arms Reaching  - 1 x daily - 4-5 x weekly - 1-2 sets - 10 reps - Forward Reach Using Hip Strategy Forward Backward  - 1 x daily - 4-5 x weekly - 1-2 sets - 10 reps - Forward Reach Using Hip Strategy Side to Side  - 1 x daily - 4-5 x weekly - 1-2 sets - 10 reps  GOALS: Goals reviewed with patient? Yes  SHORT TERM GOALS: Target date: 05/23/2024  Pt will be independent and compliant with introductory HEP in order to maintain functional progress and improve mobility. Baseline:  To be established. Goal status: INITIAL  2.  Pt will maintain 5xSTS timing but demonstrate improved mechanics for safety in order to demonstrate decreased risk for falls and improved functional bilateral LE strength and power. Baseline: 11.75 seconds w/ intermittent BUE support - posterior bias Goal status:  INITIAL  3.  TUG to be assessed w/ STG set as appropriate. Baseline: 13.90 sec no AD CGA - LTG only (10/21) Goal status: MET   4.  Pt will increase BERG balance score to >/=48/56 to demonstrate improved static balance. Baseline: 43/56 (10/21) Goal status: INITIAL  LONG TERM GOALS: Target date: 06/20/2024  Pt will be independent and compliant with advanced and finalized balance focused HEP in order to maintain functional progress and improve mobility. Baseline: To be established. Goal status: INITIAL  2.  Pt will improve ABC Scale score to >/= 63.125% in order to demonstrate decreased risk of falling and improved confidence in high level mobility. Baseline: 53.125% Goal status: INITIAL  3.  Pt will demonstrate TUG of </=12 seconds in order to decrease risk of falls and improve functional mobility using LRAD. Baseline: 13.90 sec no AD CGA (10/21) Goal status: INITIAL  4.  Pt will increase BERG balance score to >/=53/56 to demonstrate improved static balance. Baseline: 43/56 (10/21) Goal status: INITIAL  5.  Pt to ambulate w/ most appropriate AD option >/=150 ft without significant LOB and no more than SBA in order to improve ambulatory safety and tolerance. Baseline: Effortful gait w/ severe bilateral toe drag and knee hyperextension w/o AD/bracing Goal status: INITIAL  6.  Most appropriate bracing/toe cap combination to be assessed and order to be requested as appropriate. Baseline: Discussed options on eval. Goal status: INITIAL  ASSESSMENT:  CLINICAL IMPRESSION: Assessed toe caps combined w/ posterior Thuasne AFOs today without significant improvement in gait mechanics.  There is a possibility he could benefit from custom AFOs for improved knee control and certainly from toe caps, but he would like to compare to his dress shoes before committint to singular option.  No AD trialed today  offered significant change in gait mechanics or safety, but will trial 2WW in future.   Continue per POC.  OBJECTIVE IMPAIRMENTS: Abnormal gait, decreased activity tolerance, decreased balance, decreased coordination, decreased endurance, decreased knowledge of condition, decreased knowledge of use of DME, difficulty walking, impaired perceived functional ability, impaired tone, impaired UE functional use, impaired vision/preception, and improper body mechanics.   ACTIVITY LIMITATIONS: carrying, lifting, bending, standing, squatting, stairs, transfers, reach over head, and locomotion level  PARTICIPATION LIMITATIONS: meal prep, cleaning, interpersonal relationship, community activity, occupation, and yard work  PERSONAL FACTORS: Age, Past/current experiences, Time since onset of injury/illness/exacerbation, and 1-2 comorbidities: ongoing diagnostic process, prior SCI/cervical myelopathy w/ resultant spasticity/ataxia are also affecting patient's functional outcome.   REHAB POTENTIAL: Good  CLINICAL DECISION MAKING: Unstable/unpredictable  EVALUATION COMPLEXITY: High  PLAN:  PT FREQUENCY: 2x/week  PT DURATION: 8 weeks  PLANNED INTERVENTIONS: [id number]- PT Re-evaluation, 97750- Physical Performance Testing, 97110-Therapeutic exercises, 97530- Therapeutic activity, [id number]- Neuromuscular re-education, 97535- Self Care, [id number]- Manual therapy, [id number]- Gait training, [phone]- Orthotic Initial, [phone]- Orthotic/Prosthetic subsequent, [phone]- Aquatic Therapy, [phone]- Electrical stimulation (manual), Patient/Family education, Balance training, Stair training, Taping, Vestibular training, and DME instructions  PLAN FOR NEXT SESSION: Expand HEP - BLE coordination, strength and balance.  Assess  possible cane vs rollator use.  Work on environmental manager - need to work on outdoors/unlevel/obstacles.  Blaze pods- assign color to LE/alternating LE and UE/varied surfaces, coordination - toe taps, step up knee drives; hamstring strength; request order for toe caps when compared to dress shoes.  Ankle  wts; pt to bring his dress shoes for gait assessment; Trial 2WW.   Daved KATHEE Bull, PT, DPT 05/09/2024, 5:20 PM

## 2024-05-12 ENCOUNTER — Other Ambulatory Visit: Payer: Self-pay

## 2024-05-12 DIAGNOSIS — F17210 Nicotine dependence, cigarettes, uncomplicated: Secondary | ICD-10-CM

## 2024-05-12 DIAGNOSIS — Z122 Encounter for screening for malignant neoplasm of respiratory organs: Secondary | ICD-10-CM

## 2024-05-12 DIAGNOSIS — Z87891 Personal history of nicotine dependence: Secondary | ICD-10-CM

## 2024-05-13 ENCOUNTER — Encounter: Payer: Self-pay | Admitting: Physical Therapy

## 2024-05-13 ENCOUNTER — Ambulatory Visit: Attending: Neurology | Admitting: Physical Therapy

## 2024-05-13 DIAGNOSIS — R278 Other lack of coordination: Secondary | ICD-10-CM | POA: Diagnosis not present

## 2024-05-13 DIAGNOSIS — R482 Apraxia: Secondary | ICD-10-CM | POA: Insufficient documentation

## 2024-05-13 DIAGNOSIS — R29898 Other symptoms and signs involving the musculoskeletal system: Secondary | ICD-10-CM | POA: Insufficient documentation

## 2024-05-13 DIAGNOSIS — R208 Other disturbances of skin sensation: Secondary | ICD-10-CM | POA: Insufficient documentation

## 2024-05-13 DIAGNOSIS — R2681 Unsteadiness on feet: Secondary | ICD-10-CM | POA: Insufficient documentation

## 2024-05-13 DIAGNOSIS — R270 Ataxia, unspecified: Secondary | ICD-10-CM | POA: Insufficient documentation

## 2024-05-13 DIAGNOSIS — R2689 Other abnormalities of gait and mobility: Secondary | ICD-10-CM | POA: Insufficient documentation

## 2024-05-13 DIAGNOSIS — R41842 Visuospatial deficit: Secondary | ICD-10-CM | POA: Insufficient documentation

## 2024-05-13 DIAGNOSIS — R351 Nocturia: Secondary | ICD-10-CM | POA: Diagnosis not present

## 2024-05-13 DIAGNOSIS — R260 Ataxic gait: Secondary | ICD-10-CM | POA: Insufficient documentation

## 2024-05-13 DIAGNOSIS — N401 Enlarged prostate with lower urinary tract symptoms: Secondary | ICD-10-CM | POA: Diagnosis not present

## 2024-05-13 DIAGNOSIS — R972 Elevated prostate specific antigen [PSA]: Secondary | ICD-10-CM | POA: Diagnosis not present

## 2024-05-13 NOTE — Therapy (Signed)
 OUTPATIENT PHYSICAL THERAPY NEURO TREATMENT   Patient Name: Brandon Heath MRN: [id number] DOB:08/22/1955, 68 y.o., male Today's Date: 05/13/2024   PCP: Clarice Nottingham, MD REFERRING PROVIDER: Onita Duos, MD  END OF SESSION:  PT End of Session - 05/13/24 1541     Visit Number 5    Number of Visits 17   16 + eval   Date for Recertification  07/04/24   pushed out due to scheduling delays   Authorization Type [employer]    Authorization Time Period requested 10/16    PT Start Time 1535    PT Stop Time 1615    PT Time Calculation (min) 40 min    Equipment Utilized During Treatment Gait belt    Activity Tolerance Patient tolerated treatment well    Behavior During Therapy [facility] for tasks assessed/performed          Past Medical History:  Diagnosis Date   Anxiety    Cervical myelopathy (HCC) 04/16/2019   Depression    Gait disorder 04/16/2019   Past Surgical History:  Procedure Laterality Date   LAMINECTOMY     cervical    TONSILLECTOMY     Patient Active Problem List   Diagnosis Date Noted   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   Hx of excision of lamina of cervical vertebra for decompression of spinal cord 02/05/2024   Colon cancer screening 01/31/2024   Difficulty walking 01/31/2024   Family history of colon cancer 01/31/2024   History of colonic polyps 01/31/2024   Cervical myelopathy (HCC) 04/16/2019   Gait disorder 04/16/2019   Tobacco use 12/21/2011   Anxiety 12/06/2011   Muscle spasm of both lower legs 12/06/2011   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   Night sweats 11/20/2011   Skin mole 11/20/2011    ONSET DATE: 6-12 months ago  REFERRING DIAG: R26.9 (ICD-10-CM) - Gait disorder Z98.890 (ICD-10-CM) - Hx of excision of lamina of cervical vertebra for decompression of spinal cord R27.0 (ICD-10-CM) - Ataxia  THERAPY DIAG:  Ataxic gait  Unsteadiness on feet  Other abnormalities of gait and mobility  Other lack of coordination  Apraxia  Rationale for Evaluation and Treatment:  Rehabilitation  SUBJECTIVE:                                                                                                                                                                                             SUBJECTIVE STATEMENT: Pt denies recent falls since visit prior.  He ambulates w/ less notable tone today but ongoing toe drag.  He reports he is scrapping the idea of my dress shoes partially because I didn't want to dig them out of  my closet and partially because if they worked I would have been using them.  He is agreeable to proceed with getting order for bilateral toe caps.   FROM EVAL:  Pt is actively undergoing genetic testing with [facility] in regards to diagnostic process for ongoing symptoms and follows up with [facility].  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  Pt continues to work for [employer] - has motor deficits of hands (L worse).  Has some tingling in digits 1-3 bilaterally but left is new.  On observation he has hyperextension of bilateral fingers.  When just sitting still he feels perfectly normal.  2 years ago he was totally normal and able to move them out of his 2nd floor apt.  He reports the only lasting symptom of his prior SCI from 30-40 years ago is increased bouncing of his RLE worse than his LLE (appears controlled in sitting and returns even after pressure applied through limb).  He endorses that his current symptoms are new and have increased over the last 6-12 months with no known trigger/injury.  He reports he face planted forward with every recent fall and can feel that his drags his toes.  He shows therapist his shoes with beginnings of holes worn into the first inch of his shoes bilaterally - noted on ambulation into clinic as well as significant bilateral genu recurvatum.  He has tried Baclofen because I bounce when I walk.  He feels he can walk more freely with his shoulders rounded but notes they frequently stiffen as he walks pulling him  backwards.  He is unsure of number of falls but knows it's more than 6 in the recent 6 months, always forward usually due to catching his toe.  When he wears dress shoes he has less toe catch in the house.  He provides paper OT referral from [facility] with PT and states one of his MD feels he has more UE deficits - he is aware that finger-to-nose is very hard for him. Pt accompanied by: self (pt drives himself)  PERTINENT HISTORY: Cervical myelopathy s/p laminectomy, anxiety, ataxia, tobacco use, spasticity, muscle spasms  PAIN:  Are you having pain? No  PRECAUTIONS: Fall  RED FLAGS: None   WEIGHT BEARING RESTRICTIONS: No  FALLS: Has patient fallen in last 6 months? Yes. Number of falls >/=6; always falls forward onto hands and knees usually due to toe catch  LIVING ENVIRONMENT: Lives with: lives with their spouse - has 2 cats and small dog Lives in: House/apartment (1st floor apt) Stairs: No Has following equipment at home: Wheelchair (manual)  PLOF: Independent  PATIENT GOALS: walk to my desk or my car and not have it be an adventure  OBJECTIVE:  Note: Objective measures were completed at Evaluation unless otherwise noted.  DIAGNOSTIC FINDINGS:  Brain and cervical MRI from 8/6 IMPRESSION: Unremarkable MRI scan of the brain with and without contrast. IMPRESSION: MRI scan of the cervical spine with and without contrast showing stable changes of posterior decompression from C3-C6 with prominent disc and facet degenerative changes resulting in severe bilateral foraminal narrowing on the right at C3-4, on the left at C4-5 and bilaterally at C7-T1.  No abnormal postcontrast enhancement is noted.  Overall no significant change compared with previous MRI from 01/18/2021.  Thoracic MRI 8/12 IMPRESSION:  Normal MRI thoracic spine (without).  COGNITION: Overall cognitive status: Within functional limits for tasks assessed   SENSATION: Light touch: WFL  COORDINATION: LE RAMS:  mildly  slower, but  coordinated Heel-to-shin:  WNL bilaterally  EDEMA:  None significant in BLE  MUSCLE TONE: LLE: Modifed Ashworth Scale 1+ = Slight increase in muscle tone, manifested by a catch, followed by minimal resistance throughout the remainder (less than half) of the ROM and Bilateral clonus and RLE tone MAS same as LLE  POSTURE: forward head and posterior pelvic tilt - mild forward head  LOWER EXTREMITY ROM:     Active  Right Eval Left Eval  Hip flexion WFL bilaterally  Hip extension   Hip abduction   Hip adduction   Hip internal rotation   Hip external rotation   Knee flexion   Knee extension   Ankle dorsiflexion   Ankle plantarflexion   Ankle inversion    Ankle eversion     (Blank rows = not tested)  LOWER EXTREMITY MMT:    MMT Right Eval Left Eval  Hip flexion 4-/5 4-/5  Hip extension    Hip abduction 4/5 4/5  Hip adduction    Hip internal rotation    Hip external rotation    Knee flexion    Knee extension 5/5 5/5  Ankle dorsiflexion 4/5 4+/5  Ankle plantarflexion    Ankle inversion    Ankle eversion    (Blank rows = not tested)  BED MOBILITY:  Findings: Sit to supine Complete Independence Supine to sit Complete Independence Rolling to Right Complete Independence Rolling to Left Complete Independence  TRANSFERS: Sit to stand: SBA  Assistive device utilized: None     Stand to sit: SBA  Assistive device utilized: None     Chair to chair: SBA and CGA  Assistive device utilized: None       RAMP:  Not tested  CURB:  Not tested  STAIRS: Not tested - pt reports no trouble with stairs that he has noted GAIT: Findings: Gait Characteristics: step to pattern, step through pattern, decreased arm swing- Right, decreased arm swing- Left, decreased step length- Right, decreased step length- Left, decreased stride length, decreased hip/knee flexion- Right, decreased hip/knee flexion- Left, decreased ankle dorsiflexion- Right, decreased ankle dorsiflexion-  Left, scissoring, ataxic, decreased trunk rotation, narrow BOS, poor foot clearance- Right, and poor foot clearance- Left, Distance walked: various clinic distances, Assistive device utilized:None, Level of assistance: SBA and CGA, and Comments: repeated toe catch bilaterally - shoe assessment shoes severely worn toes of shoes  FUNCTIONAL TESTS:  5 times sit to stand: 11.75 sec int BUE support to catch uncontrolled lower - posterior bias resulting in uncontrolled lower and poorly coordinated sequencing Timed up and go (TUG): TBA Berg Balance Scale: TBA   PATIENT SURVEYS:  ABC scale: The Activities-Specific Balance Confidence (ABC) Scale 0% 10 20 30  40 50 60 70 80 90 100% No confidence<->completely confident  "How confident are you that you will not lose your balance or become unsteady when you . . .   Date tested 04/23/2024  Walk around the house 50%  2. Walk up or down stairs 100%  3. Bend over and pick up a slipper from in front of a closet floor 100%  4. Reach for a small can off a shelf at eye level 100%  5. Stand on tip toes and reach for something above your head 100%  6. Stand on a chair and reach for something 100%  7. Sweep the floor 100%  8. Walk outside the house to a car parked in the driveway 0%  9. Get into or out of a car 50%  10. Walk across a  parking lot to the mall 0%  11. Walk up or down a ramp 0%  12. Walk in a crowded mall where people rapidly walk past you 0%  13. Are bumped into by people as you walk through the mall 0%  14. Step onto or off of an escalator while you are holding onto the railing 100%  15. Step onto or off an escalator while holding onto parcels such that you cannot hold onto the railing 50%  16. Walk outside on icy sidewalks 0%  Total: #/16 850/16 = 53.125%                                                                                                                                 TREATMENT DATE: 05/13/2024  -Discussed process for obtaining  toe caps.   -SciFit x 8 minutes in dual peaks mode up to level 6.0 using BUE/BLE for endurance training and large amplitude reciprocal mobility; RPE 7/10 at end of task. -Reviewed sideways step w/ arms reaching as pt feels he is not doing this right.  GAIT: Gait pattern: step to pattern, step through pattern, decreased arm swing- Right, decreased arm swing- Left, decreased step length- Right, decreased step length- Left, decreased stance time- Right, decreased stance time- Left, decreased stride length, scissoring, ataxic, narrow BOS, poor foot clearance- Right, and poor foot clearance- Left Distance walked: 100' + 250' + 115' (level indoor surfaces) Assistive device utilized: Environmental Consultant - 2 wheeled and None Level of assistance: SBA and CGA Comments: Swing through improved on second bout of gait w/ bilateral toe caps and 2WW.  Discussed benefits of 2WW - pt open to obtaining order, but may not use regularly but verbalizes understanding of benefit to community mobility. Trialed bilateral 3lb ankle wts w/ some improvement in spasticity impacting stance, but pt reports he does not like these as he feels too grounded and heavy as if these shorten his stride.  Notably less scissoring w/ wts and on second round w/ 2WW and toe caps.  PATIENT EDUCATION: Education details: Continue HEP 5 days per week.  Toe cap order/process.  Call neurologist for referral to neuro-ophthalmology - he reports original referral that was recommended was never placed. Person educated: Patient Education method: Explanation, Demonstration, Verbal cues, and Handouts Education comprehension: verbalized understanding and needs further education  HOME EXERCISE PROGRAM: Access Code: [email or web address] URL: [email or web address] Date: 04/29/2024 Prepared by: Daved Bull  Exercises - Standing March with Alternating Med [facility]  - 1 x daily - 4-5 x weekly - 1-2 sets - 10 reps - Corner Balance Feet Together With Eyes  Closed  - 1 x daily - 4-5 x weekly - 1 sets - 2-3 reps - 30-45 seconds hold - Standing Balance with Eyes Closed on Foam  - 1 x daily - 4-5 x weekly - 1 sets - 2-3 reps - 30-45 seconds hold - Step Sideways with Arms Reaching  - 1 x daily -  4-5 x weekly - 1-2 sets - 10 reps - Forward Reach Using Hip Strategy Forward Backward  - 1 x daily - 4-5 x weekly - 1-2 sets - 10 reps - Forward Reach Using Hip Strategy Side to Side  - 1 x daily - 4-5 x weekly - 1-2 sets - 10 reps  GOALS: Goals reviewed with patient? Yes  SHORT TERM GOALS: Target date: 05/23/2024  Pt will be independent and compliant with introductory HEP in order to maintain functional progress and improve mobility. Baseline:  To be established. Goal status: INITIAL  2.  Pt will maintain 5xSTS timing but demonstrate improved mechanics for safety in order to demonstrate decreased risk for falls and improved functional bilateral LE strength and power. Baseline: 11.75 seconds w/ intermittent BUE support - posterior bias Goal status: INITIAL  3.  TUG to be assessed w/ STG set as appropriate. Baseline: 13.90 sec no AD CGA - LTG only (10/21) Goal status: MET   4.  Pt will increase BERG balance score to >/=48/56 to demonstrate improved static balance. Baseline: 43/56 (10/21) Goal status: INITIAL  LONG TERM GOALS: Target date: 06/20/2024  Pt will be independent and compliant with advanced and finalized balance focused HEP in order to maintain functional progress and improve mobility. Baseline: To be established. Goal status: INITIAL  2.  Pt will improve ABC Scale score to >/= 63.125% in order to demonstrate decreased risk of falling and improved confidence in high level mobility. Baseline: 53.125% Goal status: INITIAL  3.  Pt will demonstrate TUG of </=12 seconds in order to decrease risk of falls and improve functional mobility using LRAD. Baseline: 13.90 sec no AD CGA (10/21) Goal status: INITIAL  4.  Pt will increase BERG  balance score to >/=53/56 to demonstrate improved static balance. Baseline: 43/56 (10/21) Goal status: INITIAL  5.  Pt to ambulate w/ most appropriate AD option >/=150 ft without significant LOB and no more than SBA in order to improve ambulatory safety and tolerance. Baseline: Effortful gait w/ severe bilateral toe drag and knee hyperextension w/o AD/bracing Goal status: INITIAL  6.  Most appropriate bracing/toe cap combination to be assessed and order to be requested as appropriate. Baseline: Discussed options on eval. Goal status: INITIAL  ASSESSMENT:  CLINICAL IMPRESSION: Pt in agreement to pursue toe cap and 2WW orders today as he finds these most beneficial of all tools assessed.  He may benefit from AFO options, but option trialed in clinic worsened gait mechanics and he felt less in control so elected not to pursue at this time.  He continues to benefit from skilled PT to further address ataxia and improve motor planning and general control for safest upright mobility. Continue per POC.  OBJECTIVE IMPAIRMENTS: Abnormal gait, decreased activity tolerance, decreased balance, decreased coordination, decreased endurance, decreased knowledge of condition, decreased knowledge of use of DME, difficulty walking, impaired perceived functional ability, impaired tone, impaired UE functional use, impaired vision/preception, and improper body mechanics.   ACTIVITY LIMITATIONS: carrying, lifting, bending, standing, squatting, stairs, transfers, reach over head, and locomotion level  PARTICIPATION LIMITATIONS: meal prep, cleaning, interpersonal relationship, community activity, occupation, and yard work  PERSONAL FACTORS: Age, Past/current experiences, Time since onset of injury/illness/exacerbation, and 1-2 comorbidities: ongoing diagnostic process, prior SCI/cervical myelopathy w/ resultant spasticity/ataxia are also affecting patient's functional outcome.   REHAB POTENTIAL: Good  CLINICAL  DECISION MAKING: Unstable/unpredictable  EVALUATION COMPLEXITY: High  PLAN:  PT FREQUENCY: 2x/week  PT DURATION: 8 weeks  PLANNED INTERVENTIONS: 02835- PT Re-evaluation, 97750-  Physical Performance Testing, 97110-Therapeutic exercises, 97530- Therapeutic activity, [id number]- Neuromuscular re-education, 97535- Self Care, 02859- Manual therapy, [id number]- Gait training, [phone]- Orthotic Initial, [phone]- Orthotic/Prosthetic subsequent, [phone]- Aquatic Therapy, [phone]- Electrical stimulation (manual), Patient/Family education, Balance training, Stair training, Taping, Vestibular training, and DME instructions  PLAN FOR NEXT SESSION: Expand HEP - BLE coordination, strength and balance.  Assess  possible cane vs rollator use.  Work on environmental manager - need to work on outdoors/unlevel/obstacles.  Blaze pods- assign color to LE/alternating LE and UE/varied surfaces, coordination - toe taps, step up knee drives; hamstring strength;  Ankle wts for obstacle management; pt to bring his dress shoes for gait assessment; Did we receive toe cap and 2WW orders?   Daved KATHEE Bull, PT, DPT 05/13/2024, 4:20 PM

## 2024-05-14 NOTE — Therapy (Signed)
 OUTPATIENT OCCUPATIONAL THERAPY NEURO EVALUATION  Patient Name: Brandon Heath MRN: [id number] DOB:07/25/1955, 68 y.o., male Today's Date: 05/20/2024  PCP: Clarice Nottingham, MD REFERRING PROVIDER: Kathlene Jenel Meter, MD  END OF SESSION:  OT End of Session - 05/20/24 1612     Visit Number 1    Number of Visits 16    Date for Recertification  07/20/24    Authorization Type BC/BS - Auth not required, VL: 30    OT Start Time 1457   Pt arrived late   OT Stop Time 1540    OT Time Calculation (min) 43 min    Activity Tolerance Patient tolerated treatment well    Behavior During Therapy [facility] for tasks assessed/performed          Past Medical History:  Diagnosis Date   Anxiety    Cervical myelopathy (HCC) 04/16/2019   Depression    Gait disorder 04/16/2019   Past Surgical History:  Procedure Laterality Date   LAMINECTOMY     cervical    TONSILLECTOMY     Patient Active Problem List   Diagnosis Date Noted   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   Hx of excision of lamina of cervical vertebra for decompression of spinal cord 02/05/2024   Colon cancer screening 01/31/2024   Difficulty walking 01/31/2024   Family history of colon cancer 01/31/2024   History of colonic polyps 01/31/2024   Cervical myelopathy (HCC) 04/16/2019   Gait disorder 04/16/2019   Tobacco use 12/21/2011   Anxiety 12/06/2011   Muscle spasm of both lower legs 12/06/2011   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   Night sweats 11/20/2011   Skin mole 11/20/2011    ONSET DATE: 04/23/2024 (referral date)  REFERRING DIAG:  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 Pt continues to work for [employer] - has motor deficits of hands (L worse). Has some tingling in digits 1-3 bilaterally but left is new  Cervical myelopathy s/p laminectomy, anxiety, ataxia, tobacco use, spasticity, muscle spasms   PRECAUTIONS: Fall  WEIGHT BEARING RESTRICTIONS: No  PAIN:  Are you having pain? No  FALLS: Has patient fallen in last 6 months? Yes. 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  HAND DOMINANCE: Right  ADLs: Overall ADLs: mod I  Transfers/ambulation related to ADLs: independent but difficulty with balance Eating: independent  Grooming: independent  UB Dressing: buttons more difficulty but  mod I  LB Dressing: independent Toileting: independent Bathing: mod I  Tub Shower transfers: mod I - but recommends grab bars Equipment: none  IADLs: Shopping: order online and has groceries delivered [employer]: mod I  Meal Prep: wife does the cooking, pt makes sandwiches and heat up things on stove Community mobility: independent Medication management: independent Landscape architect:  independent  Handwriting: 90% legible and in print  MOBILITY STATUS: Independent   UPPER EXTREMITY ROM:  BUE AROM WNL's except difficulty controlling hand movements (graded control)   UPPER EXTREMITY MMT:   WFL's   HAND FUNCTION: Grip strength: Right: 42 lbs; Left: 63.7 lbs  COORDINATION: Finger Nose Finger test: impaired LUE w/ increased tremors 9 Hole Peg test: Right: 32.76 sec; Left: 44.47 sec  SENSATION: First 3 fingers of both hands (Rt hand more dulled from old SCI, Lt hand this year from shingles - more electric)   EDEMA: none   COGNITION: Overall cognitive status: Within functional limits for tasks assessed  VISION: Subjective report: Intermittent diplopia reported. I have prism  glasses. When I move my head, it takes a moment to focus Baseline vision: Wears glasses all the time but does remove sometimes for reading Visual history: see above  VISION ASSESSMENT: Tracking/Visual pursuits: Decreased smoothness with horizontal tracking and nystagmus Rt eye looking Rt, Lt eye when looking Lt Convergence: WFL Diplopia assessment: intermittent Saccades: impaired    PERCEPTION: Not tested  PRAXIS: Not tested  OBSERVATIONS: hyperextension in PIP joints bilaterally but worse in Lt hand long and ring finger - appears more d/t splaying of hands d/t decreased ability to control/grade opening, tremors mild (appears more at rest), coordination worse Lt side, but decreased grip strength Rt hand; abnormal visual presentation                                                                                                                             TREATMENT DATE: 05/20/24  Discussed O.T. findings and POC/goals.   Also discussed odd movements of eyes with horizontal tracking and impaired saccades  Recommended at least installing grab bars in shower to reduce fall risk     PATIENT EDUCATION: Education details: see above Person educated: Patient Education method:  Explanation Education comprehension: verbalized understanding  HOME EXERCISE PROGRAM: N/A   GOALS: Goals reviewed with patient? Yes  SHORT TERM GOALS: Target date: 06/19/24  Pt to be independent with HEP for bilateral coordination Baseline: Goal status: INITIAL  2.  Pt to be independent with Oval 8 finger splints for digits prn to help minimize/prevent PIP hyperextension Baseline:  Goal status: INITIAL  3.  Pt to verbalize understanding of adaptations on computer and possible voice recognition software to increase efficiency with his job duties Baseline:  Goal status: INITIAL  4.  Pt to verbalize understanding with safety considerations d/t sensory loss in hands (first 3 digits bilaterally) Baseline:  Goal status: INITIAL  5.  Pt to verbalize understanding with visual strategies and possible  need for community resources Baseline:  Goal status: INITIAL    LONG TERM GOALS: Target date: 07/20/24  Independent with updated HEP prn Baseline:  Goal status: INITIAL  2.  Pt to report greater ease with buttons using A/E prn Baseline:  Goal status: INITIAL  3.  Pt to verbalize understanding with DME for fall prevention while showering  Baseline:  Goal status: INITIAL  4.  Pt to improve Lt hand coordination as evidenced by reducing speed on 9 hole peg test to 40 sec or less Baseline: 44.47 sec Goal status: INITIAL  5.  Pt to improve Rt grip strength to 48 lbs or greater Baseline: Rt = 42 lbs (Lt = 63 lbs)  Goal status: INITIAL   ASSESSMENT:  CLINICAL IMPRESSION: Patient is a 68 y.o. 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 Hx includes  Cervical myelopathy s/p laminectomy ([facility] 30-40 years ago), anxiety, ataxia and spasticity, and shingles earlier 2025. Patient currently presents below baseline level of functioning demonstrating functional deficits and impairments as noted below. Pt would  benefit from skilled OT services in the outpatient setting to work on impairments as noted below to help pt return to PLOF as able.     PERFORMANCE DEFICITS: in functional skills including ADLs, IADLs, coordination, strength, Fine motor control, Gross motor control, mobility, balance, decreased knowledge of precautions, decreased knowledge of use of DME, and UE functional use.   IMPAIRMENTS: are limiting patient from ADLs, IADLs, work, leisure, and social participation.   CO-MORBIDITIES: may have co-morbidities  that affects occupational performance. Patient will benefit from skilled OT to address above impairments and improve overall function.  MODIFICATION OR ASSISTANCE TO COMPLETE EVALUATION: No modification of tasks or assist necessary to complete an evaluation.  OT OCCUPATIONAL PROFILE AND HISTORY: Detailed assessment: Review of records and additional review of physical, cognitive, psychosocial history related to current functional performance.  CLINICAL DECISION MAKING: Moderate - several treatment options, min-mod task modification necessary  REHAB POTENTIAL: Fair unknown etiology  EVALUATION COMPLEXITY: Moderate    PLAN:  OT FREQUENCY: 1-2x/week  OT DURATION: up to 8 weeks  PLANNED INTERVENTIONS: 97535 self care/ADL training, 02889 therapeutic exercise, 97530 therapeutic activity, 97112 neuromuscular re-education, 97140 manual therapy, 97113 aquatic therapy, 97010 moist heat, 97760 Orthotic Initial, 97763 Orthotic/Prosthetic subsequent, functional mobility training, visual/perceptual remediation/compensation, coping strategies training, patient/family education, and DME and/or AE instructions  RECOMMENDED OTHER SERVICES: did recommend grab bars installed in shower. May need neuro opthamalogist  CONSULTED AND AGREED WITH PLAN OF CARE: Patient  PLAN FOR NEXT SESSION: progress towards [facility] [place] - further assess vision and make recommendations prn)   Burnard JINNY Roads,  OT 05/20/2024, 4:13 PM

## 2024-05-15 ENCOUNTER — Telehealth: Payer: Self-pay | Admitting: Physical Therapy

## 2024-05-15 DIAGNOSIS — R260 Ataxic gait: Secondary | ICD-10-CM

## 2024-05-15 NOTE — Telephone Encounter (Signed)
 Dr. Onita,  Gladis Sanes  was evaluated by PT on 04/23/2024 and we have been working to determine best bracing options.  The patient would benefit from bilateral toe caps as well as a 2-wheeled walker for safest ambulation.   If you agree, please place an order in [facility] workque in [facility] or fax the order to [phone].  Thank you,  Daved Bull, PT, DPT  [facility] [address] [place], [place]  [address] Phone:  [phone] Fax:  [phone]

## 2024-05-16 ENCOUNTER — Ambulatory Visit: Admitting: Physical Therapy

## 2024-05-16 ENCOUNTER — Encounter: Payer: Self-pay | Admitting: Physical Therapy

## 2024-05-16 ENCOUNTER — Other Ambulatory Visit: Payer: Self-pay | Admitting: Nurse Practitioner

## 2024-05-16 DIAGNOSIS — R2689 Other abnormalities of gait and mobility: Secondary | ICD-10-CM | POA: Diagnosis not present

## 2024-05-16 DIAGNOSIS — R270 Ataxia, unspecified: Secondary | ICD-10-CM | POA: Diagnosis not present

## 2024-05-16 DIAGNOSIS — R482 Apraxia: Secondary | ICD-10-CM

## 2024-05-16 DIAGNOSIS — R41842 Visuospatial deficit: Secondary | ICD-10-CM | POA: Diagnosis not present

## 2024-05-16 DIAGNOSIS — R208 Other disturbances of skin sensation: Secondary | ICD-10-CM | POA: Diagnosis not present

## 2024-05-16 DIAGNOSIS — R260 Ataxic gait: Secondary | ICD-10-CM

## 2024-05-16 DIAGNOSIS — R2681 Unsteadiness on feet: Secondary | ICD-10-CM

## 2024-05-16 DIAGNOSIS — R29898 Other symptoms and signs involving the musculoskeletal system: Secondary | ICD-10-CM | POA: Diagnosis not present

## 2024-05-16 DIAGNOSIS — R278 Other lack of coordination: Secondary | ICD-10-CM

## 2024-05-16 DIAGNOSIS — R972 Elevated prostate specific antigen [PSA]: Secondary | ICD-10-CM

## 2024-05-16 NOTE — Telephone Encounter (Signed)
Orders Placed This Encounter  Procedures  . Ambulatory referral to Physical Therapy      

## 2024-05-16 NOTE — Therapy (Signed)
 OUTPATIENT PHYSICAL THERAPY NEURO TREATMENT   Patient Name: Brandon Heath MRN: [id number] DOB:19-Nov-1955, 68 y.o., male Today's Date: 05/16/2024   PCP: Clarice Nottingham, MD REFERRING PROVIDER: Onita Duos, MD  END OF SESSION:  PT End of Session - 05/16/24 1543     Visit Number 6    Number of Visits 17   16 + eval   Date for Recertification  07/04/24   pushed out due to scheduling delays   Authorization Type [employer]    Authorization Time Period requested 10/16    PT Start Time 1539   pt arrived late   PT Stop Time 1618    PT Time Calculation (min) 39 min    Equipment Utilized During Treatment Gait belt    Activity Tolerance Patient tolerated treatment well    Behavior During Therapy [facility] for tasks assessed/performed          Past Medical History:  Diagnosis Date   Anxiety    Cervical myelopathy (HCC) 04/16/2019   Depression    Gait disorder 04/16/2019   Past Surgical History:  Procedure Laterality Date   LAMINECTOMY     cervical    TONSILLECTOMY     Patient Active Problem List   Diagnosis Date Noted   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   Hx of excision of lamina of cervical vertebra for decompression of spinal cord 02/05/2024   Colon cancer screening 01/31/2024   Difficulty walking 01/31/2024   Family history of colon cancer 01/31/2024   History of colonic polyps 01/31/2024   Cervical myelopathy (HCC) 04/16/2019   Gait disorder 04/16/2019   Tobacco use 12/21/2011   Anxiety 12/06/2011   Muscle spasm of both lower legs 12/06/2011   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   Night sweats 11/20/2011   Skin mole 11/20/2011    ONSET DATE: 6-12 months ago  REFERRING DIAG: R26.9 (ICD-10-CM) - Gait disorder Z98.890 (ICD-10-CM) - Hx of excision of lamina of cervical vertebra for decompression of spinal cord R27.0 (ICD-10-CM) - Ataxia  THERAPY DIAG:  Ataxic gait  Unsteadiness on feet  Other abnormalities of gait and mobility  Other lack of coordination  Apraxia  Rationale for Evaluation and  Treatment: Rehabilitation  SUBJECTIVE:                                                                                                                                                                                             SUBJECTIVE STATEMENT: Pt denies recent falls since visit prior.  He ambulates w/ less notable tone today but ongoing toe drag.  He is having no pain.   FROM EVAL:  Pt is actively undergoing genetic testing with  [facility] in regards to diagnostic process for ongoing symptoms and follows up with [facility].  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  Pt continues to work for [employer] - has motor deficits of hands (L worse).  Has some tingling in digits 1-3 bilaterally but left is new.  On observation he has hyperextension of bilateral fingers.  When just sitting still he feels perfectly normal.  2 years ago he was totally normal and able to move them out of his 2nd floor apt.  He reports the only lasting symptom of his prior SCI from 30-40 years ago is increased bouncing of his RLE worse than his LLE (appears controlled in sitting and returns even after pressure applied through limb).  He endorses that his current symptoms are new and have increased over the last 6-12 months with no known trigger/injury.  He reports he face planted forward with every recent fall and can feel that his drags his toes.  He shows therapist his shoes with beginnings of holes worn into the first inch of his shoes bilaterally - noted on ambulation into clinic as well as significant bilateral genu recurvatum.  He has tried Baclofen because I bounce when I walk.  He feels he can walk more freely with his shoulders rounded but notes they frequently stiffen as he walks pulling him backwards.  He is unsure of number of falls but knows it's more than 6 in the recent 6 months, always forward usually due to catching his toe.  When he wears dress shoes he has less toe catch in the house.  He provides  paper OT referral from [facility] with PT and states one of his MD feels he has more UE deficits - he is aware that finger-to-nose is very hard for him. Pt accompanied by: self (pt drives himself)  PERTINENT HISTORY: Cervical myelopathy s/p laminectomy, anxiety, ataxia, tobacco use, spasticity, muscle spasms  PAIN:  Are you having pain? No  PRECAUTIONS: Fall  RED FLAGS: None   WEIGHT BEARING RESTRICTIONS: No  FALLS: Has patient fallen in last 6 months? Yes. Number of falls >/=6; always falls forward onto hands and knees usually due to toe catch  LIVING ENVIRONMENT: Lives with: lives with their spouse - has 2 cats and small dog Lives in: House/apartment (1st floor apt) Stairs: No Has following equipment at home: Wheelchair (manual)  PLOF: Independent  PATIENT GOALS: walk to my desk or my car and not have it be an adventure  OBJECTIVE:  Note: Objective measures were completed at Evaluation unless otherwise noted.  DIAGNOSTIC FINDINGS:  Brain and cervical MRI from 8/6 IMPRESSION: Unremarkable MRI scan of the brain with and without contrast. IMPRESSION: MRI scan of the cervical spine with and without contrast showing stable changes of posterior decompression from C3-C6 with prominent disc and facet degenerative changes resulting in severe bilateral foraminal narrowing on the right at C3-4, on the left at C4-5 and bilaterally at C7-T1.  No abnormal postcontrast enhancement is noted.  Overall no significant change compared with previous MRI from 01/18/2021.  Thoracic MRI 8/12 IMPRESSION:  Normal MRI thoracic spine (without).  COGNITION: Overall cognitive status: Within functional limits for tasks assessed   SENSATION: Light touch: WFL  COORDINATION: LE RAMS:  mildly slower, but coordinated Heel-to-shin:  WNL bilaterally  EDEMA:  None significant in BLE  MUSCLE TONE: LLE: Modifed Ashworth Scale 1+ = Slight increase in muscle tone, manifested by a catch, followed by minimal  resistance throughout the remainder (less than  half) of the ROM and Bilateral clonus and RLE tone MAS same as LLE  POSTURE: forward head and posterior pelvic tilt - mild forward head  LOWER EXTREMITY ROM:     Active  Right Eval Left Eval  Hip flexion WFL bilaterally  Hip extension   Hip abduction   Hip adduction   Hip internal rotation   Hip external rotation   Knee flexion   Knee extension   Ankle dorsiflexion   Ankle plantarflexion   Ankle inversion    Ankle eversion     (Blank rows = not tested)  LOWER EXTREMITY MMT:    MMT Right Eval Left Eval  Hip flexion 4-/5 4-/5  Hip extension    Hip abduction 4/5 4/5  Hip adduction    Hip internal rotation    Hip external rotation    Knee flexion    Knee extension 5/5 5/5  Ankle dorsiflexion 4/5 4+/5  Ankle plantarflexion    Ankle inversion    Ankle eversion    (Blank rows = not tested)  BED MOBILITY:  Findings: Sit to supine Complete Independence Supine to sit Complete Independence Rolling to Right Complete Independence Rolling to Left Complete Independence  TRANSFERS: Sit to stand: SBA  Assistive device utilized: None     Stand to sit: SBA  Assistive device utilized: None     Chair to chair: SBA and CGA  Assistive device utilized: None       RAMP:  Not tested  CURB:  Not tested  STAIRS: Not tested - pt reports no trouble with stairs that he has noted GAIT: Findings: Gait Characteristics: step to pattern, step through pattern, decreased arm swing- Right, decreased arm swing- Left, decreased step length- Right, decreased step length- Left, decreased stride length, decreased hip/knee flexion- Right, decreased hip/knee flexion- Left, decreased ankle dorsiflexion- Right, decreased ankle dorsiflexion- Left, scissoring, ataxic, decreased trunk rotation, narrow BOS, poor foot clearance- Right, and poor foot clearance- Left, Distance walked: various clinic distances, Assistive device utilized:None, Level of assistance:  SBA and CGA, and Comments: repeated toe catch bilaterally - shoe assessment shoes severely worn toes of shoes  FUNCTIONAL TESTS:  5 times sit to stand: 11.75 sec int BUE support to catch uncontrolled lower - posterior bias resulting in uncontrolled lower and poorly coordinated sequencing Timed up and go (TUG): TBA Berg Balance Scale: TBA   PATIENT SURVEYS:  ABC scale: The Activities-Specific Balance Confidence (ABC) Scale 0% 10 20 30  40 50 60 70 80 90 100% No confidence<->completely confident  "How confident are you that you will not lose your balance or become unsteady when you . . .   Date tested 04/23/2024  Walk around the house 50%  2. Walk up or down stairs 100%  3. Bend over and pick up a slipper from in front of a closet floor 100%  4. Reach for a small can off a shelf at eye level 100%  5. Stand on tip toes and reach for something above your head 100%  6. Stand on a chair and reach for something 100%  7. Sweep the floor 100%  8. Walk outside the house to a car parked in the driveway 0%  9. Get into or out of a car 50%  10. Walk across a parking lot to the mall 0%  11. Walk up or down a ramp 0%  12. Walk in a crowded mall where people rapidly walk past you 0%  13. Are bumped into by people as you walk  through the mall 0%  14. Step onto or off of an escalator while you are holding onto the railing 100%  15. Step onto or off an escalator while holding onto parcels such that you cannot hold onto the railing 50%  16. Walk outside on icy sidewalks 0%  Total: #/16 850/16 = 53.125%                                                                                                                                 TREATMENT DATE: 05/16/2024  -Prone hamstring curls (bilateral 5lb ankle wts) x12 each LE -Prone hamstring curls (L 10lb, R 7lb) 2x12 each LE -Supine bridge hold w/ marching 10x10, cues to slow movement for coordination challenge -Step up contralateral hip drive > added  additional contralateral UE overhead raise progressing to no UE support -Countertop deadbug progressing to no UE support but pt having forward LOB so encouraged support x14 -Squats to chair w/o full upright x10, x7 prior to fatigue, PT facilitates posterior COM as pt squats on tip toes  PATIENT EDUCATION: Education details: Continue HEP 5 days per week.  Toe cap order received.  Discussed knee cage option for hyperextension - pt not interested at this time.  Focusing on low resistance and increased repetition for error correction when working out at home.  Discussed a standard walker, but PT has concerns over lift and placement w/ ataxia and making gait less efficient w/ more disruption in fluid repetition of steps. Person educated: Patient Education method: Explanation, Demonstration, Verbal cues, and Handouts Education comprehension: verbalized understanding and needs further education  HOME EXERCISE PROGRAM: Access Code: [email or web address] URL: [email or web address] Date: 04/29/2024 Prepared by: Daved Bull  Exercises - Standing March with Alternating Med [facility]  - 1 x daily - 4-5 x weekly - 1-2 sets - 10 reps - Corner Balance Feet Together With Eyes Closed  - 1 x daily - 4-5 x weekly - 1 sets - 2-3 reps - 30-45 seconds hold - Standing Balance with Eyes Closed on Foam  - 1 x daily - 4-5 x weekly - 1 sets - 2-3 reps - 30-45 seconds hold - Step Sideways with Arms Reaching  - 1 x daily - 4-5 x weekly - 1-2 sets - 10 reps - Forward Reach Using Hip Strategy Forward Backward  - 1 x daily - 4-5 x weekly - 1-2 sets - 10 reps - Forward Reach Using Hip Strategy Side to Side  - 1 x daily - 4-5 x weekly - 1-2 sets - 10 reps  GOALS: Goals reviewed with patient? Yes  SHORT TERM GOALS: Target date: 05/23/2024  Pt will be independent and compliant with introductory HEP in order to maintain functional progress and improve mobility. Baseline:  To be established. Goal status:  INITIAL  2.  Pt will maintain 5xSTS timing but demonstrate improved mechanics for safety in order to demonstrate decreased risk for falls and improved functional bilateral LE  strength and power. Baseline: 11.75 seconds w/ intermittent BUE support - posterior bias Goal status: INITIAL  3.  TUG to be assessed w/ STG set as appropriate. Baseline: 13.90 sec no AD CGA - LTG only (10/21) Goal status: MET   4.  Pt will increase BERG balance score to >/=48/56 to demonstrate improved static balance. Baseline: 43/56 (10/21) Goal status: INITIAL  LONG TERM GOALS: Target date: 06/20/2024  Pt will be independent and compliant with advanced and finalized balance focused HEP in order to maintain functional progress and improve mobility. Baseline: To be established. Goal status: INITIAL  2.  Pt will improve ABC Scale score to >/= 63.125% in order to demonstrate decreased risk of falling and improved confidence in high level mobility. Baseline: 53.125% Goal status: INITIAL  3.  Pt will demonstrate TUG of </=12 seconds in order to decrease risk of falls and improve functional mobility using LRAD. Baseline: 13.90 sec no AD CGA (10/21) Goal status: INITIAL  4.  Pt will increase BERG balance score to >/=53/56 to demonstrate improved static balance. Baseline: 43/56 (10/21) Goal status: INITIAL  5.  Pt to ambulate w/ most appropriate AD option >/=150 ft without significant LOB and no more than SBA in order to improve ambulatory safety and tolerance. Baseline: Effortful gait w/ severe bilateral toe drag and knee hyperextension w/o AD/bracing Goal status: INITIAL  6.  Most appropriate bracing/toe cap combination to be assessed and order to be requested as appropriate. Baseline: Discussed options on eval. Goal status: INITIAL  ASSESSMENT:  CLINICAL IMPRESSION: Focus of skilled PT session today on addressing hamstring weakness and coordination.  Again today his ataxia was more subdued but his toe  drag remains.  PT has received orders for toe caps and 2WW so will pursue equipment on behalf of pt.  He continues to benefit from skilled PT to optimize motor planning and error correction as able to improve fall risk and safety. Continue per POC.  OBJECTIVE IMPAIRMENTS: Abnormal gait, decreased activity tolerance, decreased balance, decreased coordination, decreased endurance, decreased knowledge of condition, decreased knowledge of use of DME, difficulty walking, impaired perceived functional ability, impaired tone, impaired UE functional use, impaired vision/preception, and improper body mechanics.   ACTIVITY LIMITATIONS: carrying, lifting, bending, standing, squatting, stairs, transfers, reach over head, and locomotion level  PARTICIPATION LIMITATIONS: meal prep, cleaning, interpersonal relationship, community activity, occupation, and yard work  PERSONAL FACTORS: Age, Past/current experiences, Time since onset of injury/illness/exacerbation, and 1-2 comorbidities: ongoing diagnostic process, prior SCI/cervical myelopathy w/ resultant spasticity/ataxia are also affecting patient's functional outcome.   REHAB POTENTIAL: Good  CLINICAL DECISION MAKING: Unstable/unpredictable  EVALUATION COMPLEXITY: High  PLAN:  PT FREQUENCY: 2x/week  PT DURATION: 8 weeks  PLANNED INTERVENTIONS: [id number]- PT Re-evaluation, 97750- Physical Performance Testing, 97110-Therapeutic exercises, 97530- Therapeutic activity, [id number]- Neuromuscular re-education, 97535- Self Care, [id number]- Manual therapy, [id number]- Gait training, [phone]- Orthotic Initial, [phone]- Orthotic/Prosthetic subsequent, [phone]- Aquatic Therapy, [phone]- Electrical stimulation (manual), Patient/Family education, Balance training, Stair training, Taping, Vestibular training, and DME instructions  PLAN FOR NEXT SESSION: Expand HEP - BLE coordination, strength and balance.  Assess  possible cane vs rollator use.  Work on environmental manager - need to work  on outdoors/unlevel/obstacles.  Blaze pods- assign color to LE/alternating LE and UE/varied surfaces, coordination - toe taps, leg press, lifting mechanics, weighted push/pull, hamstring strength;  Ankle wts for obstacle management   Daved KATHEE Bull, PT, DPT 05/16/2024, 4:45 PM

## 2024-05-16 NOTE — Addendum Note (Signed)
 Addended by: Worth Kober on: 05/16/2024 11:42 AM   Modules accepted: Orders

## 2024-05-19 ENCOUNTER — Encounter: Payer: Self-pay | Admitting: Nurse Practitioner

## 2024-05-20 ENCOUNTER — Encounter: Payer: Self-pay | Admitting: Physical Therapy

## 2024-05-20 ENCOUNTER — Ambulatory Visit: Admitting: Occupational Therapy

## 2024-05-20 ENCOUNTER — Ambulatory Visit: Admitting: Physical Therapy

## 2024-05-20 DIAGNOSIS — R278 Other lack of coordination: Secondary | ICD-10-CM

## 2024-05-20 DIAGNOSIS — R2689 Other abnormalities of gait and mobility: Secondary | ICD-10-CM | POA: Diagnosis not present

## 2024-05-20 DIAGNOSIS — R482 Apraxia: Secondary | ICD-10-CM | POA: Diagnosis not present

## 2024-05-20 DIAGNOSIS — R270 Ataxia, unspecified: Secondary | ICD-10-CM

## 2024-05-20 DIAGNOSIS — R260 Ataxic gait: Secondary | ICD-10-CM | POA: Diagnosis not present

## 2024-05-20 DIAGNOSIS — R29898 Other symptoms and signs involving the musculoskeletal system: Secondary | ICD-10-CM | POA: Diagnosis not present

## 2024-05-20 DIAGNOSIS — R208 Other disturbances of skin sensation: Secondary | ICD-10-CM | POA: Diagnosis not present

## 2024-05-20 DIAGNOSIS — R2681 Unsteadiness on feet: Secondary | ICD-10-CM

## 2024-05-20 DIAGNOSIS — R41842 Visuospatial deficit: Secondary | ICD-10-CM

## 2024-05-20 NOTE — Therapy (Signed)
 OUTPATIENT PHYSICAL THERAPY NEURO TREATMENT   Patient Name: Brandon Heath MRN: [id number] DOB:21-Jul-1955, 68 y.o., male Today's Date: 05/20/2024   PCP: Clarice Nottingham, MD REFERRING PROVIDER: Onita Duos, MD  END OF SESSION:  PT End of Session - 05/20/24 1540     Visit Number 7    Number of Visits 17   16 + eval   Date for Recertification  07/04/24   pushed out due to scheduling delays   Authorization Type [employer]    Authorization Time Period requested 10/16    PT Start Time 1540   patient completing OT eval   PT Stop Time 1624    PT Time Calculation (min) 44 min    Equipment Utilized During Treatment Gait belt    Activity Tolerance Patient tolerated treatment well    Behavior During Therapy WFL for tasks assessed/performed          Past Medical History:  Diagnosis Date   Anxiety    Cervical myelopathy (HCC) 04/16/2019   Depression    Gait disorder 04/16/2019   Past Surgical History:  Procedure Laterality Date   LAMINECTOMY     cervical    TONSILLECTOMY     Patient Active Problem List   Diagnosis Date Noted   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   Hx of excision of lamina of cervical vertebra for decompression of spinal cord 02/05/2024   Colon cancer screening 01/31/2024   Difficulty walking 01/31/2024   Family history of colon cancer 01/31/2024   History of colonic polyps 01/31/2024   Cervical myelopathy (HCC) 04/16/2019   Gait disorder 04/16/2019   Tobacco use 12/21/2011   Anxiety 12/06/2011   Muscle spasm of both lower legs 12/06/2011   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   Night sweats 11/20/2011   Skin mole 11/20/2011    ONSET DATE: 6-12 months ago  REFERRING DIAG: R26.9 (ICD-10-CM) - Gait disorder Z98.890 (ICD-10-CM) - Hx of excision of lamina of cervical vertebra for decompression of spinal cord R27.0 (ICD-10-CM) - Ataxia  THERAPY DIAG:  Ataxic gait  Unsteadiness on feet  Other abnormalities of gait and mobility  Other lack of coordination  Apraxia  Rationale for  Evaluation and Treatment: Rehabilitation  SUBJECTIVE:                                                                                                                                                                                             SUBJECTIVE STATEMENT: Pt denies recent falls since visit prior.  He saw OT prior for evaluation.  He has a theory that he walks straighter when he has no shoes on and his toes can grip the ground.  He has noticed he only wobbles forward.  He ambulates w/ less notable tone today as less notable toe drag.  He is having no pain.  He felt he walked a little better for about 2 days after last session.   FROM EVAL:  Pt is actively undergoing genetic testing with [facility] in regards to diagnostic process for ongoing symptoms and follows up with [facility].  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  Pt continues to work for [employer] - has motor deficits of hands (L worse).  Has some tingling in digits 1-3 bilaterally but left is new.  On observation he has hyperextension of bilateral fingers.  When just sitting still he feels perfectly normal.  2 years ago he was totally normal and able to move them out of his 2nd floor apt.  He reports the only lasting symptom of his prior SCI from 30-40 years ago is increased bouncing of his RLE worse than his LLE (appears controlled in sitting and returns even after pressure applied through limb).  He endorses that his current symptoms are new and have increased over the last 6-12 months with no known trigger/injury.  He reports he face planted forward with every recent fall and can feel that his drags his toes.  He shows therapist his shoes with beginnings of holes worn into the first inch of his shoes bilaterally - noted on ambulation into clinic as well as significant bilateral genu recurvatum.  He has tried Baclofen because I bounce when I walk.  He feels he can walk more freely with his shoulders rounded but notes they  frequently stiffen as he walks pulling him backwards.  He is unsure of number of falls but knows it's more than 6 in the recent 6 months, always forward usually due to catching his toe.  When he wears dress shoes he has less toe catch in the house.  He provides paper OT referral from [facility] with PT and states one of his MD feels he has more UE deficits - he is aware that finger-to-nose is very hard for him. Pt accompanied by: self (pt drives himself)  PERTINENT HISTORY: Cervical myelopathy s/p laminectomy, anxiety, ataxia, tobacco use, spasticity, muscle spasms  PAIN:  Are you having pain? No  PRECAUTIONS: Fall  RED FLAGS: None   WEIGHT BEARING RESTRICTIONS: No  FALLS: Has patient fallen in last 6 months? Yes. Number of falls >/=6; always falls forward onto hands and knees usually due to toe catch  LIVING ENVIRONMENT: Lives with: lives with their spouse - has 2 cats and small dog Lives in: House/apartment (1st floor apt) Stairs: No Has following equipment at home: Wheelchair (manual)  PLOF: Independent  PATIENT GOALS: walk to my desk or my car and not have it be an adventure  OBJECTIVE:  Note: Objective measures were completed at Evaluation unless otherwise noted.  DIAGNOSTIC FINDINGS:  Brain and cervical MRI from 8/6 IMPRESSION: Unremarkable MRI scan of the brain with and without contrast. IMPRESSION: MRI scan of the cervical spine with and without contrast showing stable changes of posterior decompression from C3-C6 with prominent disc and facet degenerative changes resulting in severe bilateral foraminal narrowing on the right at C3-4, on the left at C4-5 and bilaterally at C7-T1.  No abnormal postcontrast enhancement is noted.  Overall no significant change compared with previous MRI from 01/18/2021.  Thoracic MRI 8/12 IMPRESSION:  Normal MRI thoracic spine (without).  COGNITION: Overall cognitive status: Within functional limits for tasks assessed  SENSATION: Light  touch: WFL  COORDINATION: LE RAMS:  mildly slower, but coordinated Heel-to-shin:  WNL bilaterally  EDEMA:  None significant in BLE  MUSCLE TONE: LLE: Modifed Ashworth Scale 1+ = Slight increase in muscle tone, manifested by a catch, followed by minimal resistance throughout the remainder (less than half) of the ROM and Bilateral clonus and RLE tone MAS same as LLE  POSTURE: forward head and posterior pelvic tilt - mild forward head  LOWER EXTREMITY ROM:     Active  Right Eval Left Eval  Hip flexion WFL bilaterally  Hip extension   Hip abduction   Hip adduction   Hip internal rotation   Hip external rotation   Knee flexion   Knee extension   Ankle dorsiflexion   Ankle plantarflexion   Ankle inversion    Ankle eversion     (Blank rows = not tested)  LOWER EXTREMITY MMT:    MMT Right Eval Left Eval  Hip flexion 4-/5 4-/5  Hip extension    Hip abduction 4/5 4/5  Hip adduction    Hip internal rotation    Hip external rotation    Knee flexion    Knee extension 5/5 5/5  Ankle dorsiflexion 4/5 4+/5  Ankle plantarflexion    Ankle inversion    Ankle eversion    (Blank rows = not tested)  BED MOBILITY:  Findings: Sit to supine Complete Independence Supine to sit Complete Independence Rolling to Right Complete Independence Rolling to Left Complete Independence  TRANSFERS: Sit to stand: SBA  Assistive device utilized: None     Stand to sit: SBA  Assistive device utilized: None     Chair to chair: SBA and CGA  Assistive device utilized: None       RAMP:  Not tested  CURB:  Not tested  STAIRS: Not tested - pt reports no trouble with stairs that he has noted GAIT: Findings: Gait Characteristics: step to pattern, step through pattern, decreased arm swing- Right, decreased arm swing- Left, decreased step length- Right, decreased step length- Left, decreased stride length, decreased hip/knee flexion- Right, decreased hip/knee flexion- Left, decreased ankle  dorsiflexion- Right, decreased ankle dorsiflexion- Left, scissoring, ataxic, decreased trunk rotation, narrow BOS, poor foot clearance- Right, and poor foot clearance- Left, Distance walked: various clinic distances, Assistive device utilized:None, Level of assistance: SBA and CGA, and Comments: repeated toe catch bilaterally - shoe assessment shoes severely worn toes of shoes  FUNCTIONAL TESTS:  5 times sit to stand: 11.75 sec int BUE support to catch uncontrolled lower - posterior bias resulting in uncontrolled lower and poorly coordinated sequencing Timed up and go (TUG): TBA Berg Balance Scale: TBA   PATIENT SURVEYS:  ABC scale: The Activities-Specific Balance Confidence (ABC) Scale 0% 10 20 30  40 50 60 70 80 90 100% No confidence<->completely confident  "How confident are you that you will not lose your balance or become unsteady when you . . .   Date tested 04/23/2024  Walk around the house 50%  2. Walk up or down stairs 100%  3. Bend over and pick up a slipper from in front of a closet floor 100%  4. Reach for a small can off a shelf at eye level 100%  5. Stand on tip toes and reach for something above your head 100%  6. Stand on a chair and reach for something 100%  7. Sweep the floor 100%  8. Walk outside the house to a car parked in the driveway 0%  9.  Get into or out of a car 50%  10. Walk across a parking lot to the mall 0%  11. Walk up or down a ramp 0%  12. Walk in a crowded mall where people rapidly walk past you 0%  13. Are bumped into by people as you walk through the mall 0%  14. Step onto or off of an escalator while you are holding onto the railing 100%  15. Step onto or off an escalator while holding onto parcels such that you cannot hold onto the railing 50%  16. Walk outside on icy sidewalks 0%  Total: #/16 850/16 = 53.125%                                                                                                                                 TREATMENT  DATE: 05/20/2024  6 Blaze pods on focus on one color setting for improved visual scanning, reaction time, and reaching outside of BOS.  Performed on 1 minute intervals with 30 second rest periods.  Pt requires SBA-CGA guarding. Round 1:  floor, chair and mirror pod setup.  22 hits, 1 error. Round 2:   setup.  27 hits. Round 3:   setup.  28 hits. Notable errors/deficits:  Difficulty consistently scanning to the left.  6 Blaze pods on focus on one color setting for improved visual scanning, reaction time, and reaching outside of BOS w/ additional of cognitive dual tasking.  Performed on 1 minute intervals with 30 second rest periods.  Pt requires SBA-CGA guarding. Round 1:  floor, chair and mirror pod setup.  15 hits. Round 2:   setup.  18 hits. Round 3:   setup.  21 hits. Notable errors/deficits:  Frequent repetition.  Forward LOB x1 w/ modA to recover.  8lb slam balls x10 > alt step out slam x5 each LE > walking slams x50 ft Resisted STS x15 no UE support > toe on incline repeated resisted STS no UE support x15  PATIENT EDUCATION: Education details: Continue HEP 5 days per week.  Process for toe caps and 2WW - orders faxed (need to be refaxed due to busy signal). Person educated: Patient Education method: Explanation, Demonstration, Verbal cues, and Handouts Education comprehension: verbalized understanding and needs further education  HOME EXERCISE PROGRAM: Access Code: [email or web address] URL: [email or web address] Date: 04/29/2024 Prepared by: Daved Bull  Exercises - Standing March with Alternating Med [facility]  - 1 x daily - 4-5 x weekly - 1-2 sets - 10 reps - Corner Balance Feet Together With Eyes Closed  - 1 x daily - 4-5 x weekly - 1 sets - 2-3 reps - 30-45 seconds hold - Standing Balance with Eyes Closed on Foam  - 1 x daily - 4-5 x weekly - 1 sets - 2-3 reps - 30-45 seconds hold - Step Sideways with Arms Reaching  - 1 x daily - 4-5 x weekly - 1-2 sets - 10  reps -  Forward Reach Using Hip Strategy Forward Backward  - 1 x daily - 4-5 x weekly - 1-2 sets - 10 reps - Forward Reach Using Hip Strategy Side to Side  - 1 x daily - 4-5 x weekly - 1-2 sets - 10 reps  GOALS: Goals reviewed with patient? Yes  SHORT TERM GOALS: Target date: 05/23/2024  Pt will be independent and compliant with introductory HEP in order to maintain functional progress and improve mobility. Baseline:  To be established. Goal status: INITIAL  2.  Pt will maintain 5xSTS timing but demonstrate improved mechanics for safety in order to demonstrate decreased risk for falls and improved functional bilateral LE strength and power. Baseline: 11.75 seconds w/ intermittent BUE support - posterior bias Goal status: INITIAL  3.  TUG to be assessed w/ STG set as appropriate. Baseline: 13.90 sec no AD CGA - LTG only (10/21) Goal status: MET   4.  Pt will increase BERG balance score to >/=48/56 to demonstrate improved static balance. Baseline: 43/56 (10/21) Goal status: INITIAL  LONG TERM GOALS: Target date: 06/20/2024  Pt will be independent and compliant with advanced and finalized balance focused HEP in order to maintain functional progress and improve mobility. Baseline: To be established. Goal status: INITIAL  2.  Pt will improve ABC Scale score to >/= 63.125% in order to demonstrate decreased risk of falling and improved confidence in high level mobility. Baseline: 53.125% Goal status: INITIAL  3.  Pt will demonstrate TUG of </=12 seconds in order to decrease risk of falls and improve functional mobility using LRAD. Baseline: 13.90 sec no AD CGA (10/21) Goal status: INITIAL  4.  Pt will increase BERG balance score to >/=53/56 to demonstrate improved static balance. Baseline: 43/56 (10/21) Goal status: INITIAL  5.  Pt to ambulate w/ most appropriate AD option >/=150 ft without significant LOB and no more than SBA in order to improve ambulatory safety and  tolerance. Baseline: Effortful gait w/ severe bilateral toe drag and knee hyperextension w/o AD/bracing Goal status: INITIAL  6.  Most appropriate bracing/toe cap combination to be assessed and order to be requested as appropriate. Baseline: Discussed options on eval. Goal status: INITIAL  ASSESSMENT:  CLINICAL IMPRESSION: Ongoing emphasis on LE coordination and motor efficiency without UE support.  Pt maintains anterior bias creating instability with reaching outside BOS and repeated STS.  He stands to benefit from ongoing skilled PT intervention in this setting to further improve motor planning and decreased muscle workload for daily mobility tasks.  Will also spend time working to improve use of toe caps and 2WW for improved safety.  Continue per POC.  OBJECTIVE IMPAIRMENTS: Abnormal gait, decreased activity tolerance, decreased balance, decreased coordination, decreased endurance, decreased knowledge of condition, decreased knowledge of use of DME, difficulty walking, impaired perceived functional ability, impaired tone, impaired UE functional use, impaired vision/preception, and improper body mechanics.   ACTIVITY LIMITATIONS: carrying, lifting, bending, standing, squatting, stairs, transfers, reach over head, and locomotion level  PARTICIPATION LIMITATIONS: meal prep, cleaning, interpersonal relationship, community activity, occupation, and yard work  PERSONAL FACTORS: Age, Past/current experiences, Time since onset of injury/illness/exacerbation, and 1-2 comorbidities: ongoing diagnostic process, prior SCI/cervical myelopathy w/ resultant spasticity/ataxia are also affecting patient's functional outcome.   REHAB POTENTIAL: Good  CLINICAL DECISION MAKING: Unstable/unpredictable  EVALUATION COMPLEXITY: High  PLAN:  PT FREQUENCY: 2x/week  PT DURATION: 8 weeks  PLANNED INTERVENTIONS: 97164- PT Re-evaluation, 97750- Physical Performance Testing, 97110-Therapeutic exercises, 97530-  Therapeutic activity, [id number]- Neuromuscular re-education, 97535- Self Care,  02859- Manual therapy, [id number]- Gait training, 02239- Orthotic Initial, [id number]- Orthotic/Prosthetic subsequent, [id number]- Aquatic Therapy, [phone]- Electrical stimulation (manual), Patient/Family education, Balance training, Stair training, Taping, Vestibular training, and DME instructions  PLAN FOR NEXT SESSION: Expand HEP - BLE coordination, strength and balance.  Assess  possible cane vs rollator use.  Work on environmental manager - need to work on outdoors/unlevel/obstacles.  Blaze pods- assign color to LE/alternating LE and UE/varied surfaces, coordination - toe taps, leg press, lifting mechanics, weighted push/pull, hamstring strength;  Ankle wts for obstacle management; resisted walking, perturbations; ASSESS STGs!   Daved KATHEE Bull, PT, DPT 05/20/2024, 4:54 PM

## 2024-05-23 ENCOUNTER — Ambulatory Visit: Admitting: Physical Therapy

## 2024-05-23 ENCOUNTER — Encounter: Payer: Self-pay | Admitting: Physical Therapy

## 2024-05-23 DIAGNOSIS — R482 Apraxia: Secondary | ICD-10-CM | POA: Diagnosis not present

## 2024-05-23 DIAGNOSIS — R270 Ataxia, unspecified: Secondary | ICD-10-CM

## 2024-05-23 DIAGNOSIS — R2681 Unsteadiness on feet: Secondary | ICD-10-CM

## 2024-05-23 DIAGNOSIS — R260 Ataxic gait: Secondary | ICD-10-CM

## 2024-05-23 DIAGNOSIS — R2689 Other abnormalities of gait and mobility: Secondary | ICD-10-CM

## 2024-05-23 DIAGNOSIS — R41842 Visuospatial deficit: Secondary | ICD-10-CM | POA: Diagnosis not present

## 2024-05-23 DIAGNOSIS — R278 Other lack of coordination: Secondary | ICD-10-CM | POA: Diagnosis not present

## 2024-05-23 DIAGNOSIS — R29898 Other symptoms and signs involving the musculoskeletal system: Secondary | ICD-10-CM | POA: Diagnosis not present

## 2024-05-23 DIAGNOSIS — R208 Other disturbances of skin sensation: Secondary | ICD-10-CM

## 2024-05-23 NOTE — Therapy (Signed)
 OUTPATIENT PHYSICAL THERAPY NEURO TREATMENT   Patient Name: Ad Guttman MRN: [id number] DOB:1956/01/31, 68 y.o., male Today's Date: 05/23/2024   PCP: Clarice Nottingham, MD REFERRING PROVIDER: Onita Duos, MD  END OF SESSION:  PT End of Session - 05/23/24 1536     Visit Number 8    Number of Visits 17   16 + eval   Date for Recertification  07/04/24   pushed out due to scheduling delays   Authorization Type [employer]    Authorization Time Period requested 10/16    PT Start Time 1534    PT Stop Time 1614    PT Time Calculation (min) 40 min    Equipment Utilized During Treatment Gait belt    Activity Tolerance Patient tolerated treatment well    Behavior During Therapy [facility] for tasks assessed/performed          Past Medical History:  Diagnosis Date   Anxiety    Cervical myelopathy (HCC) 04/16/2019   Depression    Gait disorder 04/16/2019   Past Surgical History:  Procedure Laterality Date   LAMINECTOMY     cervical    TONSILLECTOMY     Patient Active Problem List   Diagnosis Date Noted   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   Hx of excision of lamina of cervical vertebra for decompression of spinal cord 02/05/2024   Colon cancer screening 01/31/2024   Difficulty walking 01/31/2024   Family history of colon cancer 01/31/2024   History of colonic polyps 01/31/2024   Cervical myelopathy (HCC) 04/16/2019   Gait disorder 04/16/2019   Tobacco use 12/21/2011   Anxiety 12/06/2011   Muscle spasm of both lower legs 12/06/2011   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   Night sweats 11/20/2011   Skin mole 11/20/2011    ONSET DATE: 6-12 months ago  REFERRING DIAG: R26.9 (ICD-10-CM) - Gait disorder Z98.890 (ICD-10-CM) - Hx of excision of lamina of cervical vertebra for decompression of spinal cord R27.0 (ICD-10-CM) - Ataxia  THERAPY DIAG:  Ataxia  Other lack of coordination  Unsteadiness on feet  Other disturbances of skin sensation  Ataxic gait  Other abnormalities of gait and  mobility  Apraxia  Rationale for Evaluation and Treatment: Rehabilitation  SUBJECTIVE:                                                                                                                                                                                             SUBJECTIVE STATEMENT: Pt denies recent falls since visit prior.  He received a voicemail from [employer] and has to call them back about his walker.  He is having no pain.    FROM  EVAL:  Pt is actively undergoing genetic testing with [facility] in regards to diagnostic process for ongoing symptoms and follows up with [facility].  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  Pt continues to work for [employer] - has motor deficits of hands (L worse).  Has some tingling in digits 1-3 bilaterally but left is new.  On observation he has hyperextension of bilateral fingers.  When just sitting still he feels perfectly normal.  2 years ago he was totally normal and able to move them out of his 2nd floor apt.  He reports the only lasting symptom of his prior SCI from 30-40 years ago is increased bouncing of his RLE worse than his LLE (appears controlled in sitting and returns even after pressure applied through limb).  He endorses that his current symptoms are new and have increased over the last 6-12 months with no known trigger/injury.  He reports he face planted forward with every recent fall and can feel that his drags his toes.  He shows therapist his shoes with beginnings of holes worn into the first inch of his shoes bilaterally - noted on ambulation into clinic as well as significant bilateral genu recurvatum.  He has tried Baclofen because I bounce when I walk.  He feels he can walk more freely with his shoulders rounded but notes they frequently stiffen as he walks pulling him backwards.  He is unsure of number of falls but knows it's more than 6 in the recent 6 months, always forward usually due to catching his toe.  When he  wears dress shoes he has less toe catch in the house.  He provides paper OT referral from [facility] with PT and states one of his MD feels he has more UE deficits - he is aware that finger-to-nose is very hard for him. Pt accompanied by: self (pt drives himself)  PERTINENT HISTORY: Cervical myelopathy s/p laminectomy, anxiety, ataxia, tobacco use, spasticity, muscle spasms  PAIN:  Are you having pain? No  PRECAUTIONS: Fall  RED FLAGS: None   WEIGHT BEARING RESTRICTIONS: No  FALLS: Has patient fallen in last 6 months? Yes. Number of falls >/=6; always falls forward onto hands and knees usually due to toe catch  LIVING ENVIRONMENT: Lives with: lives with their spouse - has 2 cats and small dog Lives in: House/apartment (1st floor apt) Stairs: No Has following equipment at home: Wheelchair (manual)  PLOF: Independent  PATIENT GOALS: walk to my desk or my car and not have it be an adventure  OBJECTIVE:  Note: Objective measures were completed at Evaluation unless otherwise noted.  DIAGNOSTIC FINDINGS:  Brain and cervical MRI from 8/6 IMPRESSION: Unremarkable MRI scan of the brain with and without contrast. IMPRESSION: MRI scan of the cervical spine with and without contrast showing stable changes of posterior decompression from C3-C6 with prominent disc and facet degenerative changes resulting in severe bilateral foraminal narrowing on the right at C3-4, on the left at C4-5 and bilaterally at C7-T1.  No abnormal postcontrast enhancement is noted.  Overall no significant change compared with previous MRI from 01/18/2021.  Thoracic MRI 8/12 IMPRESSION:  Normal MRI thoracic spine (without).  COGNITION: Overall cognitive status: Within functional limits for tasks assessed   SENSATION: Light touch: WFL  COORDINATION: LE RAMS:  mildly slower, but coordinated Heel-to-shin:  WNL bilaterally  EDEMA:  None significant in BLE  MUSCLE TONE: LLE: Modifed Ashworth Scale 1+ = Slight  increase in muscle tone, manifested by a catch,  followed by minimal resistance throughout the remainder (less than half) of the ROM and Bilateral clonus and RLE tone MAS same as LLE  POSTURE: forward head and posterior pelvic tilt - mild forward head  LOWER EXTREMITY ROM:     Active  Right Eval Left Eval  Hip flexion WFL bilaterally  Hip extension   Hip abduction   Hip adduction   Hip internal rotation   Hip external rotation   Knee flexion   Knee extension   Ankle dorsiflexion   Ankle plantarflexion   Ankle inversion    Ankle eversion     (Blank rows = not tested)  LOWER EXTREMITY MMT:    MMT Right Eval Left Eval  Hip flexion 4-/5 4-/5  Hip extension    Hip abduction 4/5 4/5  Hip adduction    Hip internal rotation    Hip external rotation    Knee flexion    Knee extension 5/5 5/5  Ankle dorsiflexion 4/5 4+/5  Ankle plantarflexion    Ankle inversion    Ankle eversion    (Blank rows = not tested)  BED MOBILITY:  Findings: Sit to supine Complete Independence Supine to sit Complete Independence Rolling to Right Complete Independence Rolling to Left Complete Independence  TRANSFERS: Sit to stand: SBA  Assistive device utilized: None     Stand to sit: SBA  Assistive device utilized: None     Chair to chair: SBA and CGA  Assistive device utilized: None       RAMP:  Not tested  CURB:  Not tested  STAIRS: Not tested - pt reports no trouble with stairs that he has noted GAIT: Findings: Gait Characteristics: step to pattern, step through pattern, decreased arm swing- Right, decreased arm swing- Left, decreased step length- Right, decreased step length- Left, decreased stride length, decreased hip/knee flexion- Right, decreased hip/knee flexion- Left, decreased ankle dorsiflexion- Right, decreased ankle dorsiflexion- Left, scissoring, ataxic, decreased trunk rotation, narrow BOS, poor foot clearance- Right, and poor foot clearance- Left, Distance walked: various  clinic distances, Assistive device utilized:None, Level of assistance: SBA and CGA, and Comments: repeated toe catch bilaterally - shoe assessment shoes severely worn toes of shoes  FUNCTIONAL TESTS:  5 times sit to stand: 11.75 sec int BUE support to catch uncontrolled lower - posterior bias resulting in uncontrolled lower and poorly coordinated sequencing Timed up and go (TUG): TBA Berg Balance Scale: TBA   PATIENT SURVEYS:  ABC scale: The Activities-Specific Balance Confidence (ABC) Scale 0% 10 20 30  40 50 60 70 80 90 100% No confidence<->completely confident  "How confident are you that you will not lose your balance or become unsteady when you . . .   Date tested 04/23/2024  Walk around the house 50%  2. Walk up or down stairs 100%  3. Bend over and pick up a slipper from in front of a closet floor 100%  4. Reach for a small can off a shelf at eye level 100%  5. Stand on tip toes and reach for something above your head 100%  6. Stand on a chair and reach for something 100%  7. Sweep the floor 100%  8. Walk outside the house to a car parked in the driveway 0%  9. Get into or out of a car 50%  10. Walk across a parking lot to the mall 0%  11. Walk up or down a ramp 0%  12. Walk in a crowded mall where people rapidly walk past you 0%  13. Are bumped into by people as you walk through the mall 0%  14. Step onto or off of an escalator while you are holding onto the railing 100%  15. Step onto or off an escalator while holding onto parcels such that you cannot hold onto the railing 50%  16. Walk outside on icy sidewalks 0%  Total: #/16 850/16 = 53.125%                                                                                                                                 TREATMENT DATE: 05/23/2024  5xSTS:  10.00 sec no UE support - pt has less posterior bias and no LE touching into standing; 7.00 sec w/ BUE support similar mechanics to without hand support BERG:  [facility] PT  Assessment - 05/23/24 1544       Berg Balance Test   Sit to Stand Able to stand without using hands and stabilize independently    Standing Unsupported Able to stand safely 2 minutes    Sitting with Back Unsupported but Feet Supported on Floor or Stool Able to sit safely and securely 2 minutes    Stand to Sit Sits safely with minimal use of hands    Transfers Able to transfer safely, minor use of hands    Standing Unsupported with Eyes Closed Able to stand 10 seconds safely    Standing Unsupported with Feet Together Able to place feet together independently and stand 1 minute safely    From Standing, Reach Forward with Outstretched Arm Can reach confidently >25 cm (10)    From Standing Position, Pick up Object from Floor Able to pick up shoe safely and easily    From Standing Position, Turn to Look Behind Over each Shoulder Looks behind from both sides and weight shifts well    Turn 360 Degrees Able to turn 360 degrees safely in 4 seconds or less   R slower, more ataxic   Standing Unsupported, Alternately Place Feet on Step/Stool Able to complete 4 steps without aid or supervision    Standing Unsupported, One Foot in Front Able to take small step independently and hold 30 seconds    Standing on One Leg Able to lift leg independently and hold 5-10 seconds    Total Score 51    Berg comment: 51/56 = cusp of lower fall risk         -SciFit x8 minutes in sprint mode at level 5.0 using BUE/BLE for cardiac endurance and large amplitude reciprocal mobility.  Cued for stride of 10-12 inches w/ average 11.4.  PATIENT EDUCATION: Education details: Continue HEP 5 days per week.  Everything faxed to [employer] - they should call within next week, call [employer] back ASAP.  Improvements noted and progress towards goals.  Benefits of muscle endurance and ongoing aerobic work at home via walking, etc for maintaining improved stride due to inefficiency and muscle fatigue w/ ataxia. Person educated:  Patient Education method: Explanation, Demonstration, Verbal cues, and Handouts Education comprehension: verbalized understanding and needs further education  HOME EXERCISE PROGRAM: Access Code: [email or web address] URL: [email or web address] Date: 04/29/2024 Prepared by: Daved Bull  Exercises - Standing March with Alternating Med [facility]  - 1 x daily - 4-5 x weekly - 1-2 sets - 10 reps - Corner Balance Feet Together With Eyes Closed  - 1 x daily - 4-5 x weekly - 1 sets - 2-3 reps - 30-45 seconds hold - Standing Balance with Eyes Closed on Foam  - 1 x daily - 4-5 x weekly - 1 sets - 2-3 reps - 30-45 seconds hold - Step Sideways with Arms Reaching  - 1 x daily - 4-5 x weekly - 1-2 sets - 10 reps - Forward Reach Using Hip Strategy Forward Backward  - 1 x daily - 4-5 x weekly - 1-2 sets - 10 reps - Forward Reach Using Hip Strategy Side to Side  - 1 x daily - 4-5 x weekly - 1-2 sets - 10 reps  GOALS: Goals reviewed with patient? Yes  SHORT TERM GOALS: Target date: 05/23/2024  Pt will be independent and compliant with introductory HEP in order to maintain functional progress and improve mobility. Baseline:  IND and compliant (11/14) Goal status: MET  2.  Pt will maintain 5xSTS timing but demonstrate improved mechanics for safety in order to demonstrate decreased risk for falls and improved functional bilateral LE strength and power. Baseline: 11.75 seconds w/ intermittent BUE support - posterior bias; 10.00 sec no UE support - pt has less posterior bias and no LE touching into standing; 7.00 sec w/ BUE support similar mechanics to without hand support (11/14) Goal status: MET  3.  TUG to be assessed w/ STG set as appropriate. Baseline: 13.90 sec no AD CGA - LTG only (10/21) Goal status: MET   4.  Pt will increase BERG balance score to >/=48/56 to demonstrate improved static balance. Baseline: 43/56 (10/21); 51/56 (11/14) Goal status: MET  LONG TERM GOALS: Target date:  06/20/2024  Pt will be independent and compliant with advanced and finalized balance focused HEP in order to maintain functional progress and improve mobility. Baseline: To be established. Goal status: INITIAL  2.  Pt will improve ABC Scale score to >/= 63.125% in order to demonstrate decreased risk of falling and improved confidence in high level mobility. Baseline: 53.125% Goal status: INITIAL  3.  Pt will demonstrate TUG of </=12 seconds in order to decrease risk of falls and improve functional mobility using LRAD. Baseline: 13.90 sec no AD CGA (10/21) Goal status: INITIAL  4.  Pt will increase BERG balance score to >/=53/56 to demonstrate improved static balance. Baseline: 43/56 (10/21) Goal status: INITIAL  5.  Pt to ambulate w/ most appropriate AD option >/=150 ft without significant LOB and no more than SBA in order to improve ambulatory safety and tolerance. Baseline: Effortful gait w/ severe bilateral toe drag and knee hyperextension w/o AD/bracing Goal status: INITIAL  6.  Most appropriate bracing/toe cap combination to be assessed and order to be requested as appropriate. Baseline: Discussed options on eval. Goal status: INITIAL  ASSESSMENT:  CLINICAL IMPRESSION: Emphasis of skilled PT session today on addressing ongoing ataxia and assessing progress via STGs.  He met 4 of 4 goals as written w/ TUG only set for LTG based on baseline performance.  He is compliant to HEP and demonstrates much improved STS mechanics today with improved time as well.  He demonstrates overall  improved BOS and maintained COM over BOS during ambulation throughout session.  His BERG score improved from prior 43 to 51/56 placing him on the cusp of the low fall risk category.  He was encouraged to continue increased walking efforts at home to maintain and progress muscular endurance and work on art gallery manager.  Will continue per POC.  OBJECTIVE IMPAIRMENTS: Abnormal gait, decreased activity  tolerance, decreased balance, decreased coordination, decreased endurance, decreased knowledge of condition, decreased knowledge of use of DME, difficulty walking, impaired perceived functional ability, impaired tone, impaired UE functional use, impaired vision/preception, and improper body mechanics.   ACTIVITY LIMITATIONS: carrying, lifting, bending, standing, squatting, stairs, transfers, reach over head, and locomotion level  PARTICIPATION LIMITATIONS: meal prep, cleaning, interpersonal relationship, community activity, occupation, and yard work  PERSONAL FACTORS: Age, Past/current experiences, Time since onset of injury/illness/exacerbation, and 1-2 comorbidities: ongoing diagnostic process, prior SCI/cervical myelopathy w/ resultant spasticity/ataxia are also affecting patient's functional outcome.   REHAB POTENTIAL: Good  CLINICAL DECISION MAKING: Unstable/unpredictable  EVALUATION COMPLEXITY: High  PLAN:  PT FREQUENCY: 2x/week  PT DURATION: 8 weeks  PLANNED INTERVENTIONS: 97164- PT Re-evaluation, 97750- Physical Performance Testing, 97110-Therapeutic exercises, 97530- Therapeutic activity, [id number]- Neuromuscular re-education, 97535- Self Care, [id number]- Manual therapy, [id number]- Gait training, [phone]- Orthotic Initial, [phone]- Orthotic/Prosthetic subsequent, [phone]- Aquatic Therapy, [phone]- Electrical stimulation (manual), Patient/Family education, Balance training, Stair training, Taping, Vestibular training, and DME instructions  PLAN FOR NEXT SESSION: Expand HEP - BLE coordination, strength and balance.  Work on environmental manager - need to work on outdoors/unlevel/obstacles.  Blaze pods- assign color to LE/alternating LE and UE/varied surfaces, coordination - toe taps, leg press, lifting mechanics, weighted push/pull, hamstring strength;  Ankle wts for obstacle management; resisted walking, perturbations;  Did he hear from [employer]?   Daved KATHEE Bull, PT, DPT 05/23/2024, 4:27  PM

## 2024-05-27 ENCOUNTER — Encounter (INDEPENDENT_AMBULATORY_CARE_PROVIDER_SITE_OTHER): Payer: Self-pay | Admitting: Neurology

## 2024-05-27 ENCOUNTER — Ambulatory Visit: Admitting: Physical Therapy

## 2024-05-27 ENCOUNTER — Encounter: Payer: Self-pay | Admitting: Physical Therapy

## 2024-05-27 DIAGNOSIS — R2681 Unsteadiness on feet: Secondary | ICD-10-CM

## 2024-05-27 DIAGNOSIS — R482 Apraxia: Secondary | ICD-10-CM

## 2024-05-27 DIAGNOSIS — R208 Other disturbances of skin sensation: Secondary | ICD-10-CM | POA: Diagnosis not present

## 2024-05-27 DIAGNOSIS — R29898 Other symptoms and signs involving the musculoskeletal system: Secondary | ICD-10-CM | POA: Diagnosis not present

## 2024-05-27 DIAGNOSIS — R270 Ataxia, unspecified: Secondary | ICD-10-CM | POA: Diagnosis not present

## 2024-05-27 DIAGNOSIS — R41842 Visuospatial deficit: Secondary | ICD-10-CM | POA: Diagnosis not present

## 2024-05-27 DIAGNOSIS — R278 Other lack of coordination: Secondary | ICD-10-CM | POA: Diagnosis not present

## 2024-05-27 DIAGNOSIS — R2689 Other abnormalities of gait and mobility: Secondary | ICD-10-CM | POA: Diagnosis not present

## 2024-05-27 DIAGNOSIS — R269 Unspecified abnormalities of gait and mobility: Secondary | ICD-10-CM | POA: Diagnosis not present

## 2024-05-27 DIAGNOSIS — R260 Ataxic gait: Secondary | ICD-10-CM

## 2024-05-27 NOTE — Therapy (Signed)
 OUTPATIENT PHYSICAL THERAPY NEURO TREATMENT   Patient Name: Brandon Heath MRN: [id number] DOB:Mar 23, 1956, 68 y.o., male Today's Date: 05/27/2024   PCP: Clarice Nottingham, MD REFERRING PROVIDER: Onita Duos, MD  END OF SESSION:  PT End of Session - 05/27/24 1633     Visit Number 9    Number of Visits 17   16 + eval   Date for Recertification  07/04/24   pushed out due to scheduling delays   Authorization Type [employer]    Authorization Time Period requested 10/16    PT Start Time 1632   pt in lobby - not checked in   PT Stop Time 1710    PT Time Calculation (min) 38 min    Equipment Utilized During Treatment Gait belt    Activity Tolerance Patient tolerated treatment well    Behavior During Therapy [facility] for tasks assessed/performed          Past Medical History:  Diagnosis Date   Anxiety    Cervical myelopathy (HCC) 04/16/2019   Depression    Gait disorder 04/16/2019   Past Surgical History:  Procedure Laterality Date   LAMINECTOMY     cervical    TONSILLECTOMY     Patient Active Problem List   Diagnosis Date Noted   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   Hx of excision of lamina of cervical vertebra for decompression of spinal cord 02/05/2024   Colon cancer screening 01/31/2024   Difficulty walking 01/31/2024   Family history of colon cancer 01/31/2024   History of colonic polyps 01/31/2024   Cervical myelopathy (HCC) 04/16/2019   Gait disorder 04/16/2019   Tobacco use 12/21/2011   Anxiety 12/06/2011   Muscle spasm of both lower legs 12/06/2011   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   Night sweats 11/20/2011   Skin mole 11/20/2011    ONSET DATE: 6-12 months ago  REFERRING DIAG: R26.9 (ICD-10-CM) - Gait disorder Z98.890 (ICD-10-CM) - Hx of excision of lamina of cervical vertebra for decompression of spinal cord R27.0 (ICD-10-CM) - Ataxia  THERAPY DIAG:  Other lack of coordination  Unsteadiness on feet  Other disturbances of skin sensation  Ataxic gait  Other abnormalities of gait and  mobility  Apraxia  Rationale for Evaluation and Treatment: Rehabilitation  SUBJECTIVE:                                                                                                                                                                                             SUBJECTIVE STATEMENT: Pt denies recent falls since visit prior.  He called [employer] back and decided not to get his walker - he has since changed his mind.  He is having no pain.  He reports feeling down due to feeling like despite objective improvement that he is the same.  Has not heard from [employer].   FROM EVAL:  Pt is actively undergoing genetic testing with [facility] in regards to diagnostic process for ongoing symptoms and follows up with [facility].  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  Pt continues to work for [employer] - has motor deficits of hands (L worse).  Has some tingling in digits 1-3 bilaterally but left is new.  On observation he has hyperextension of bilateral fingers.  When just sitting still he feels perfectly normal.  2 years ago he was totally normal and able to move them out of his 2nd floor apt.  He reports the only lasting symptom of his prior SCI from 30-40 years ago is increased bouncing of his RLE worse than his LLE (appears controlled in sitting and returns even after pressure applied through limb).  He endorses that his current symptoms are new and have increased over the last 6-12 months with no known trigger/injury.  He reports he face planted forward with every recent fall and can feel that his drags his toes.  He shows therapist his shoes with beginnings of holes worn into the first inch of his shoes bilaterally - noted on ambulation into clinic as well as significant bilateral genu recurvatum.  He has tried Baclofen because I bounce when I walk.  He feels he can walk more freely with his shoulders rounded but notes they frequently stiffen as he walks pulling him backwards.  He is  unsure of number of falls but knows it's more than 6 in the recent 6 months, always forward usually due to catching his toe.  When he wears dress shoes he has less toe catch in the house.  He provides paper OT referral from [facility] with PT and states one of his MD feels he has more UE deficits - he is aware that finger-to-nose is very hard for him. Pt accompanied by: self (pt drives himself)  PERTINENT HISTORY: Cervical myelopathy s/p laminectomy, anxiety, ataxia, tobacco use, spasticity, muscle spasms  PAIN:  Are you having pain? No  PRECAUTIONS: Fall  RED FLAGS: None   WEIGHT BEARING RESTRICTIONS: No  FALLS: Has patient fallen in last 6 months? Yes. Number of falls >/=6; always falls forward onto hands and knees usually due to toe catch  LIVING ENVIRONMENT: Lives with: lives with their spouse - has 2 cats and small dog Lives in: House/apartment (1st floor apt) Stairs: No Has following equipment at home: Wheelchair (manual)  PLOF: Independent  PATIENT GOALS: walk to my desk or my car and not have it be an adventure  OBJECTIVE:  Note: Objective measures were completed at Evaluation unless otherwise noted.  DIAGNOSTIC FINDINGS:  Brain and cervical MRI from 8/6 IMPRESSION: Unremarkable MRI scan of the brain with and without contrast. IMPRESSION: MRI scan of the cervical spine with and without contrast showing stable changes of posterior decompression from C3-C6 with prominent disc and facet degenerative changes resulting in severe bilateral foraminal narrowing on the right at C3-4, on the left at C4-5 and bilaterally at C7-T1.  No abnormal postcontrast enhancement is noted.  Overall no significant change compared with previous MRI from 01/18/2021.  Thoracic MRI 8/12 IMPRESSION:  Normal MRI thoracic spine (without).  COGNITION: Overall cognitive status: Within functional limits for tasks assessed   SENSATION: Light touch: WFL  COORDINATION: LE RAMS:  mildly slower, but  coordinated Heel-to-shin:  WNL bilaterally  EDEMA:  None significant in BLE  MUSCLE TONE: LLE: Modifed Ashworth Scale 1+ = Slight increase in muscle tone, manifested by a catch, followed by minimal resistance throughout the remainder (less than half) of the ROM and Bilateral clonus and RLE tone MAS same as LLE  POSTURE: forward head and posterior pelvic tilt - mild forward head  LOWER EXTREMITY ROM:     Active  Right Eval Left Eval  Hip flexion WFL bilaterally  Hip extension   Hip abduction   Hip adduction   Hip internal rotation   Hip external rotation   Knee flexion   Knee extension   Ankle dorsiflexion   Ankle plantarflexion   Ankle inversion    Ankle eversion     (Blank rows = not tested)  LOWER EXTREMITY MMT:    MMT Right Eval Left Eval  Hip flexion 4-/5 4-/5  Hip extension    Hip abduction 4/5 4/5  Hip adduction    Hip internal rotation    Hip external rotation    Knee flexion    Knee extension 5/5 5/5  Ankle dorsiflexion 4/5 4+/5  Ankle plantarflexion    Ankle inversion    Ankle eversion    (Blank rows = not tested)  BED MOBILITY:  Findings: Sit to supine Complete Independence Supine to sit Complete Independence Rolling to Right Complete Independence Rolling to Left Complete Independence  TRANSFERS: Sit to stand: SBA  Assistive device utilized: None     Stand to sit: SBA  Assistive device utilized: None     Chair to chair: SBA and CGA  Assistive device utilized: None       RAMP:  Not tested  CURB:  Not tested  STAIRS: Not tested - pt reports no trouble with stairs that he has noted GAIT: Findings: Gait Characteristics: step to pattern, step through pattern, decreased arm swing- Right, decreased arm swing- Left, decreased step length- Right, decreased step length- Left, decreased stride length, decreased hip/knee flexion- Right, decreased hip/knee flexion- Left, decreased ankle dorsiflexion- Right, decreased ankle dorsiflexion- Left,  scissoring, ataxic, decreased trunk rotation, narrow BOS, poor foot clearance- Right, and poor foot clearance- Left, Distance walked: various clinic distances, Assistive device utilized:None, Level of assistance: SBA and CGA, and Comments: repeated toe catch bilaterally - shoe assessment shoes severely worn toes of shoes  FUNCTIONAL TESTS:  5 times sit to stand: 11.75 sec int BUE support to catch uncontrolled lower - posterior bias resulting in uncontrolled lower and poorly coordinated sequencing Timed up and go (TUG): TBA Berg Balance Scale: TBA   PATIENT SURVEYS:  ABC scale: The Activities-Specific Balance Confidence (ABC) Scale 0% 10 20 30  40 50 60 70 80 90 100% No confidence<->completely confident  "How confident are you that you will not lose your balance or become unsteady when you . . .   Date tested 04/23/2024  Walk around the house 50%  2. Walk up or down stairs 100%  3. Bend over and pick up a slipper from in front of a closet floor 100%  4. Reach for a small can off a shelf at eye level 100%  5. Stand on tip toes and reach for something above your head 100%  6. Stand on a chair and reach for something 100%  7. Sweep the floor 100%  8. Walk outside the house to a car parked in the driveway 0%  9. Get into or out of a car 50%  10. Walk across a  parking lot to the mall 0%  11. Walk up or down a ramp 0%  12. Walk in a crowded mall where people rapidly walk past you 0%  13. Are bumped into by people as you walk through the mall 0%  14. Step onto or off of an escalator while you are holding onto the railing 100%  15. Step onto or off an escalator while holding onto parcels such that you cannot hold onto the railing 50%  16. Walk outside on icy sidewalks 0%  Total: #/16 850/16 = 53.125%                                                                                                                                 TREATMENT DATE: 05/27/2024  Provided [employer] info for pt to call  within the week and follow-up. Pt declined offer to obtain counseling referral PT provided therapeutic listening and encouragement due to ongoing concerns about progress. 8 step taps x30 no UE support SBA-CGA cuing to improve grading of taps (soft) 8 step taps w/ 6lb overhead ball hold CGA for proprioceptive input at trunk x20, increased difficulty w/ L stance 10lb KB RDL x12 > 20lb KB RDL x12 Good mornings w/ dowel x20 Seated 10lb slam balls 2x15 > seated L & R 10lb slam ball x10 each side  PATIENT EDUCATION: Education details: Continue HEP 5 days per week.  Call [employer] and [employer] if deciding to pursue.   Person educated: Patient Education method: Explanation, Demonstration, Verbal cues, and Handouts Education comprehension: verbalized understanding and needs further education  HOME EXERCISE PROGRAM: Access Code: [email or web address] URL: [email or web address] Date: 04/29/2024 Prepared by: Daved Bull  Exercises - Standing March with Alternating Med [facility]  - 1 x daily - 4-5 x weekly - 1-2 sets - 10 reps - Corner Balance Feet Together With Eyes Closed  - 1 x daily - 4-5 x weekly - 1 sets - 2-3 reps - 30-45 seconds hold - Standing Balance with Eyes Closed on Foam  - 1 x daily - 4-5 x weekly - 1 sets - 2-3 reps - 30-45 seconds hold - Step Sideways with Arms Reaching  - 1 x daily - 4-5 x weekly - 1-2 sets - 10 reps - Forward Reach Using Hip Strategy Forward Backward  - 1 x daily - 4-5 x weekly - 1-2 sets - 10 reps - Forward Reach Using Hip Strategy Side to Side  - 1 x daily - 4-5 x weekly - 1-2 sets - 10 reps  GOALS: Goals reviewed with patient? Yes  SHORT TERM GOALS: Target date: 05/23/2024  Pt will be independent and compliant with introductory HEP in order to maintain functional progress and improve mobility. Baseline:  IND and compliant (11/14) Goal status: MET  2.  Pt will maintain 5xSTS timing but demonstrate improved mechanics for safety in order to  demonstrate decreased risk for falls and improved functional bilateral LE strength and power.  Baseline: 11.75 seconds w/ intermittent BUE support - posterior bias; 10.00 sec no UE support - pt has less posterior bias and no LE touching into standing; 7.00 sec w/ BUE support similar mechanics to without hand support (11/14) Goal status: MET  3.  TUG to be assessed w/ STG set as appropriate. Baseline: 13.90 sec no AD CGA - LTG only (10/21) Goal status: MET   4.  Pt will increase BERG balance score to >/=48/56 to demonstrate improved static balance. Baseline: 43/56 (10/21); 51/56 (11/14) Goal status: MET  LONG TERM GOALS: Target date: 06/20/2024  Pt will be independent and compliant with advanced and finalized balance focused HEP in order to maintain functional progress and improve mobility. Baseline: To be established. Goal status: INITIAL  2.  Pt will improve ABC Scale score to >/= 63.125% in order to demonstrate decreased risk of falling and improved confidence in high level mobility. Baseline: 53.125% Goal status: INITIAL  3.  Pt will demonstrate TUG of </=12 seconds in order to decrease risk of falls and improve functional mobility using LRAD. Baseline: 13.90 sec no AD CGA (10/21) Goal status: INITIAL  4.  Pt will increase BERG balance score to >/=53/56 to demonstrate improved static balance. Baseline: 43/56 (10/21) Goal status: INITIAL  5.  Pt to ambulate w/ most appropriate AD option >/=150 ft without significant LOB and no more than SBA in order to improve ambulatory safety and tolerance. Baseline: Effortful gait w/ severe bilateral toe drag and knee hyperextension w/o AD/bracing Goal status: INITIAL  6.  Most appropriate bracing/toe cap combination to be assessed and order to be requested as appropriate. Baseline: Discussed options on eval. Goal status: INITIAL  ASSESSMENT:  CLINICAL IMPRESSION: Focus of skilled PT session on posterior chain and core activation as  needed for upright stability and mobility.  He continues to be challenged by worsened ataxia with muscle fatigue, but maintains great form with lifting and hinging tasks.  He would likely benefit from progression to weight push and pull tasks.  PT to further address ataxia to optimize upright mobility and maintained safe independence.  Will continue per POC to address impairments as listed and progress towards LTGs as written.  OBJECTIVE IMPAIRMENTS: Abnormal gait, decreased activity tolerance, decreased balance, decreased coordination, decreased endurance, decreased knowledge of condition, decreased knowledge of use of DME, difficulty walking, impaired perceived functional ability, impaired tone, impaired UE functional use, impaired vision/preception, and improper body mechanics.   ACTIVITY LIMITATIONS: carrying, lifting, bending, standing, squatting, stairs, transfers, reach over head, and locomotion level  PARTICIPATION LIMITATIONS: meal prep, cleaning, interpersonal relationship, community activity, occupation, and yard work  PERSONAL FACTORS: Age, Past/current experiences, Time since onset of injury/illness/exacerbation, and 1-2 comorbidities: ongoing diagnostic process, prior SCI/cervical myelopathy w/ resultant spasticity/ataxia are also affecting patient's functional outcome.   REHAB POTENTIAL: Good  CLINICAL DECISION MAKING: Unstable/unpredictable  EVALUATION COMPLEXITY: High  PLAN:  PT FREQUENCY: 2x/week  PT DURATION: 8 weeks  PLANNED INTERVENTIONS: 97164- PT Re-evaluation, 97750- Physical Performance Testing, 97110-Therapeutic exercises, 97530- Therapeutic activity, [id number]- Neuromuscular re-education, 97535- Self Care, [id number]- Manual therapy, [id number]- Gait training, [phone]- Orthotic Initial, [phone]- Orthotic/Prosthetic subsequent, [phone]- Aquatic Therapy, [phone]- Electrical stimulation (manual), Patient/Family education, Balance training, Stair training, Taping, Vestibular training, and DME  instructions  PLAN FOR NEXT SESSION: Expand HEP - BLE coordination, strength and balance.  Work on environmental manager - need to work on outdoors/unlevel/obstacles.  Blaze pods- assign color to LE/alternating LE and UE/varied surfaces, coordination - toe taps, leg press, lifting mechanics, weighted push/pull,  hamstring strength;  Ankle wts for obstacle management; resisted walking, perturbations;  Did he hear from [employer]?  Try stretching (caution due to knee hyperextension) - pt reports hamstring tightness   Daved KATHEE Bull, PT, DPT 05/27/2024, 5:11 PM

## 2024-05-29 MED ORDER — BACLOFEN 10 MG PO TABS: ORAL_TABLET | ORAL | 3 refills | Status: AC

## 2024-05-29 NOTE — Telephone Encounter (Signed)
Please see the MyChart message reply(ies) for my assessment and plan.    This patient gave consent for this Medical Advice Message and is aware that it may result in a bill to [employer], as well as the possibility of receiving a bill for a co-payment or deductible. They are an established patient, but are not seeking medical advice exclusively about a problem treated during an in person or video visit in the last seven days. I did not recommend an in person or video visit within seven days of my reply.    I spent a total of 7 minutes cumulative time within 7 days through [employer].  Marcial Pacas, MD

## 2024-05-30 ENCOUNTER — Ambulatory Visit: Admitting: Physical Therapy

## 2024-06-03 ENCOUNTER — Ambulatory Visit: Admitting: Occupational Therapy

## 2024-06-03 ENCOUNTER — Encounter: Payer: Self-pay | Admitting: Physical Therapy

## 2024-06-03 ENCOUNTER — Ambulatory Visit: Admitting: Physical Therapy

## 2024-06-03 DIAGNOSIS — R41842 Visuospatial deficit: Secondary | ICD-10-CM

## 2024-06-03 DIAGNOSIS — R2681 Unsteadiness on feet: Secondary | ICD-10-CM

## 2024-06-03 DIAGNOSIS — R208 Other disturbances of skin sensation: Secondary | ICD-10-CM

## 2024-06-03 DIAGNOSIS — R260 Ataxic gait: Secondary | ICD-10-CM

## 2024-06-03 DIAGNOSIS — R29898 Other symptoms and signs involving the musculoskeletal system: Secondary | ICD-10-CM

## 2024-06-03 DIAGNOSIS — R278 Other lack of coordination: Secondary | ICD-10-CM

## 2024-06-03 DIAGNOSIS — R482 Apraxia: Secondary | ICD-10-CM | POA: Diagnosis not present

## 2024-06-03 DIAGNOSIS — R2689 Other abnormalities of gait and mobility: Secondary | ICD-10-CM | POA: Diagnosis not present

## 2024-06-03 DIAGNOSIS — R270 Ataxia, unspecified: Secondary | ICD-10-CM | POA: Diagnosis not present

## 2024-06-03 NOTE — Patient Instructions (Signed)
Diplopia HEP:  Perform at least 3 times per day. Stop if your eye becomes fatigued or hurts and try again later.  1. Hold a small object/card in front of you.  Hold it in the middle at arm's length away.    2. Cover your LEFT eye and look at the object with your RIGHT eye.  3. Slowly move the object side to side in front of you while continuing to watch it with your RIGHT eye.  4.  Remember to keep your head still and only move your eye.  5.  Repeat 5-10 times.  6.  Then, move object up and down while watching it 5-10 times.  7. Cover your RIGHT eye and look at the object with your LEFT eye while you repeat #1-6 above.  8.  Now, uncover both eyes and try to focus on the object while holding it in the middle.  Try to make it 1 image.   9.  If you can, try to hold it for 10-30 sec increasing as able.    10.  Once you can make the image 1 for at least 30 sec in the middle, repeat #1-6 above with both eyes moving slowly and only in the range that you can keep the image 1.  

## 2024-06-03 NOTE — Therapy (Unsigned)
 OUTPATIENT PHYSICAL THERAPY NEURO TREATMENT   Patient Name: Brandon Heath MRN: [id number] DOB:09-May-1956, 68 y.o., male Today's Date: 06/03/2024   PCP: Brandon Nottingham, MD REFERRING PROVIDER: Onita Duos, MD  END OF SESSION:  PT End of Session - 06/03/24 1606     Visit Number 10    Number of Visits 17   16 + eval   Date for Recertification  07/04/24   pushed out due to scheduling delays   Authorization Type [employer]    Authorization Time Period requested 10/16    PT Start Time 1619    PT Stop Time 1708    PT Time Calculation (min) 49 min    Equipment Utilized During Treatment Gait belt    Activity Tolerance Patient tolerated treatment well    Behavior During Therapy [facility] for tasks assessed/performed          Past Medical History:  Diagnosis Date   Anxiety    Cervical myelopathy (HCC) 04/16/2019   Depression    Gait disorder 04/16/2019   Past Surgical History:  Procedure Laterality Date   LAMINECTOMY     cervical    TONSILLECTOMY     Patient Active Problem List   Diagnosis Date Noted   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   Hx of excision of lamina of cervical vertebra for decompression of spinal cord 02/05/2024   Colon cancer screening 01/31/2024   Difficulty walking 01/31/2024   Family history of colon cancer 01/31/2024   History of colonic polyps 01/31/2024   Cervical myelopathy (HCC) 04/16/2019   Gait disorder 04/16/2019   Tobacco use 12/21/2011   Anxiety 12/06/2011   Muscle spasm of both lower legs 12/06/2011   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   Night sweats 11/20/2011   Skin mole 11/20/2011    ONSET DATE: 6-12 months ago  REFERRING DIAG: R26.9 (ICD-10-CM) - Gait disorder Z98.890 (ICD-10-CM) - Hx of excision of lamina of cervical vertebra for decompression of spinal cord R27.0 (ICD-10-CM) - Ataxia  THERAPY DIAG:  Other lack of coordination  Unsteadiness on feet  Other disturbances of skin sensation  Ataxic gait  Other abnormalities of gait and  mobility  Apraxia  Rationale for Evaluation and Treatment: Rehabilitation  SUBJECTIVE:                                                                                                                                                                                             SUBJECTIVE STATEMENT: Brandon Heath  Pt denies recent falls since visit prior.  He is having no pain.  He restarted the Baclofen  and thinks this might be helping the bouncing in his walking.  He went to  [employer] today at 2pm and is to buy a new pair of shoes for toe caps in 3 weeks.  His walker was delivered today and he meant to bring it today for adjustment.   FROM EVAL:  Pt is actively undergoing genetic testing with [facility] in regards to diagnostic process for ongoing symptoms and follows up with [facility].  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  Pt continues to work for [employer] - has motor deficits of hands (L worse).  Has some tingling in digits 1-3 bilaterally but left is new.  On observation he has hyperextension of bilateral fingers.  When just sitting still he feels perfectly normal.  2 years ago he was totally normal and able to move them out of his 2nd floor apt.  He reports the only lasting symptom of his prior SCI from 30-40 years ago is increased bouncing of his RLE worse than his LLE (appears controlled in sitting and returns even after pressure applied through limb).  He endorses that his current symptoms are new and have increased over the last 6-12 months with no known trigger/injury.  He reports he face planted forward with every recent fall and can feel that his drags his toes.  He shows therapist his shoes with beginnings of holes worn into the first inch of his shoes bilaterally - noted on ambulation into clinic as well as significant bilateral genu recurvatum.  He has tried Baclofen  because I bounce when I walk.  He feels he can walk more freely with his shoulders rounded but notes they  frequently stiffen as he walks pulling him backwards.  He is unsure of number of falls but knows it's more than 6 in the recent 6 months, always forward usually due to catching his toe.  When he wears dress shoes he has less toe catch in the house.  He provides paper OT referral from [facility] with PT and states one of his MD feels he has more UE deficits - he is aware that finger-to-nose is very hard for him. Pt accompanied by: self (pt drives himself)  PERTINENT HISTORY: Cervical myelopathy s/p laminectomy, anxiety, ataxia, tobacco use, spasticity, muscle spasms  PAIN:  Are you having pain? No  PRECAUTIONS: Fall  RED FLAGS: None   WEIGHT BEARING RESTRICTIONS: No  FALLS: Has patient fallen in last 6 months? Yes. Number of falls >/=6; always falls forward onto hands and knees usually due to toe catch  LIVING ENVIRONMENT: Lives with: lives with their spouse - has 2 cats and small dog Lives in: House/apartment (1st floor apt) Stairs: No Has following equipment at home: Wheelchair (manual)  PLOF: Independent  PATIENT GOALS: walk to my desk or my car and not have it be an adventure  OBJECTIVE:  Note: Objective measures were completed at Evaluation unless otherwise noted.  DIAGNOSTIC FINDINGS:  Brain and cervical MRI from 8/6 IMPRESSION: Unremarkable MRI scan of the brain with and without contrast. IMPRESSION: MRI scan of the cervical spine with and without contrast showing stable changes of posterior decompression from C3-C6 with prominent disc and facet degenerative changes resulting in severe bilateral foraminal narrowing on the right at C3-4, on the left at C4-5 and bilaterally at C7-T1.  No abnormal postcontrast enhancement is noted.  Overall no significant change compared with previous MRI from 01/18/2021.  Thoracic MRI 8/12 IMPRESSION:  Normal MRI thoracic spine (without).  COGNITION: Overall cognitive status: Within functional limits for tasks assessed   SENSATION: Light  touch: [facility]  COORDINATION: LE RAMS:  mildly slower, but coordinated Heel-to-shin:  WNL bilaterally  EDEMA:  None significant in BLE  MUSCLE TONE: LLE: Modifed Ashworth Scale 1+ = Slight increase in muscle tone, manifested by a catch, followed by minimal resistance throughout the remainder (less than half) of the ROM and Bilateral clonus and RLE tone MAS same as LLE  POSTURE: forward head and posterior pelvic tilt - mild forward head  LOWER EXTREMITY ROM:     Active  Right Eval Left Eval  Hip flexion WFL bilaterally  Hip extension   Hip abduction   Hip adduction   Hip internal rotation   Hip external rotation   Knee flexion   Knee extension   Ankle dorsiflexion   Ankle plantarflexion   Ankle inversion    Ankle eversion     (Blank rows = not tested)  LOWER EXTREMITY MMT:    MMT Right Eval Left Eval  Hip flexion 4-/5 4-/5  Hip extension    Hip abduction 4/5 4/5  Hip adduction    Hip internal rotation    Hip external rotation    Knee flexion    Knee extension 5/5 5/5  Ankle dorsiflexion 4/5 4+/5  Ankle plantarflexion    Ankle inversion    Ankle eversion    (Blank rows = not tested)  BED MOBILITY:  Findings: Sit to supine Complete Independence Supine to sit Complete Independence Rolling to Right Complete Independence Rolling to Left Complete Independence  TRANSFERS: Sit to stand: SBA  Assistive device utilized: None     Stand to sit: SBA  Assistive device utilized: None     Chair to chair: SBA and CGA  Assistive device utilized: None       RAMP:  Not tested  CURB:  Not tested  STAIRS: Not tested - pt reports no trouble with stairs that he has noted GAIT: Findings: Gait Characteristics: step to pattern, step through pattern, decreased arm swing- Right, decreased arm swing- Left, decreased step length- Right, decreased step length- Left, decreased stride length, decreased hip/knee flexion- Right, decreased hip/knee flexion- Left, decreased ankle  dorsiflexion- Right, decreased ankle dorsiflexion- Left, scissoring, ataxic, decreased trunk rotation, narrow BOS, poor foot clearance- Right, and poor foot clearance- Left, Distance walked: various clinic distances, Assistive device utilized:None, Level of assistance: SBA and CGA, and Comments: repeated toe catch bilaterally - shoe assessment shoes severely worn toes of shoes  FUNCTIONAL TESTS:  5 times sit to stand: 11.75 sec int BUE support to catch uncontrolled lower - posterior bias resulting in uncontrolled lower and poorly coordinated sequencing Timed up and go (TUG): TBA Berg Balance Scale: TBA   PATIENT SURVEYS:  ABC scale: The Activities-Specific Balance Confidence (ABC) Scale 0% 10 20 30  40 50 60 70 80 90 100% No confidence<->completely confident  "How confident are you that you will not lose your balance or become unsteady when you . . .   Date tested 04/23/2024  Walk around the house 50%  2. Walk up or down stairs 100%  3. Bend over and pick up a slipper from in front of a closet floor 100%  4. Reach for a small can off a shelf at eye level 100%  5. Stand on tip toes and reach for something above your head 100%  6. Stand on a chair and reach for something 100%  7. Sweep the floor 100%  8. Walk outside the house to a car parked in the driveway 0%  9. Get into or out of a  car 50%  10. Walk across a parking lot to the mall 0%  11. Walk up or down a ramp 0%  12. Walk in a crowded mall where people rapidly walk past you 0%  13. Are bumped into by people as you walk through the mall 0%  14. Step onto or off of an escalator while you are holding onto the railing 100%  15. Step onto or off an escalator while holding onto parcels such that you cannot hold onto the railing 50%  16. Walk outside on icy sidewalks 0%  Total: #/16 850/16 = 53.125%                                                                                                                                 TREATMENT  DATE: 06/03/2024  6 Blaze pods on random one color taps setting for improved visual scanning, SLS, reaching and reaction time.  Performed on 1 minute intervals with 30 sec rest periods.  Pt requires SBA-minA guarding. Round 1:  mirror and ramp pod setup.  20 hits. Round 2:   setup.  21 hits. Round 3:   setup.  24 hits. Notable errors/deficits:  x1 LOB on decline w/ lateral step - minA to recover due to cross over stepping. 6 Blaze pods on random one color taps setting (added second color for L/R discrimination of LE) for improved visual scanning, SLS, and reaction time.  Performed on 1 minute intervals with 30 sec rest periods.  Pt requires SBA guarding (using BUE support on bars). Round 1:  alternating floor setup around foam beam for forward and retro stepping.  15 hits. Round 2:   setup.  17 hits. Round 3:   setup.  20 hits. Notable errors/deficits:  L = green, R = blue; pt requires verbalization to maintain pattern, x1 error 6 Blaze pods on random one color taps setting (added second color for L/R discrimination of UE) for improved visual scanning, ankle strategy, reaching and reaction time.  Performed on 1 minute intervals with 30 sec rest periods.  Pt requires SBA-CGA guarding (using intermittent unilateral UE support on ballet bar). Round 1:  Semi-circular overhead mirror pod placement w/ pt standing on A/P tilt board.  29 hits. Round 2:   setup.  33 hits. Round 3:   setup.  31 hits. Notable errors/deficits:  R = green, L = blue; pt requires verbalization to maintain pattern, no errors, able to progress to more consistent removal of UE support w/o LOB on round 3. Standing unsupported alt LE 8lb slam ball stop and roll x20  PATIENT EDUCATION: Education details: Continue HEP 5 days per week.  Way to adjust walker and using for energy efficiency to increase amount of daily walking at home. Person educated: Patient Education method: Explanation, Demonstration, Verbal cues, and  Handouts Education comprehension: verbalized understanding and needs further education  HOME EXERCISE PROGRAM: Access Code: [email or web address] URL: [email or web address] Date: 04/29/2024 Prepared  by: Daved Bull  Exercises - Standing March with Alternating Med [facility]  - 1 x daily - 4-5 x weekly - 1-2 sets - 10 reps - Corner Balance Feet Together With Eyes Closed  - 1 x daily - 4-5 x weekly - 1 sets - 2-3 reps - 30-45 seconds hold - Standing Balance with Eyes Closed on Foam  - 1 x daily - 4-5 x weekly - 1 sets - 2-3 reps - 30-45 seconds hold - Step Sideways with Arms Reaching  - 1 x daily - 4-5 x weekly - 1-2 sets - 10 reps - Forward Reach Using Hip Strategy Forward Backward  - 1 x daily - 4-5 x weekly - 1-2 sets - 10 reps - Forward Reach Using Hip Strategy Side to Side  - 1 x daily - 4-5 x weekly - 1-2 sets - 10 reps  GOALS: Goals reviewed with patient? Yes  SHORT TERM GOALS: Target date: 05/23/2024  Pt will be independent and compliant with introductory HEP in order to maintain functional progress and improve mobility. Baseline:  IND and compliant (11/14) Goal status: MET  2.  Pt will maintain 5xSTS timing but demonstrate improved mechanics for safety in order to demonstrate decreased risk for falls and improved functional bilateral LE strength and power. Baseline: 11.75 seconds w/ intermittent BUE support - posterior bias; 10.00 sec no UE support - pt has less posterior bias and no LE touching into standing; 7.00 sec w/ BUE support similar mechanics to without hand support (11/14) Goal status: MET  3.  TUG to be assessed w/ STG set as appropriate. Baseline: 13.90 sec no AD CGA - LTG only (10/21) Goal status: MET   4.  Pt will increase BERG balance score to >/=48/56 to demonstrate improved static balance. Baseline: 43/56 (10/21); 51/56 (11/14) Goal status: MET  LONG TERM GOALS: Target date: 06/20/2024  Pt will be independent and compliant with advanced and  finalized balance focused HEP in order to maintain functional progress and improve mobility. Baseline: To be established. Goal status: INITIAL  2.  Pt will improve ABC Scale score to >/= 63.125% in order to demonstrate decreased risk of falling and improved confidence in high level mobility. Baseline: 53.125% Goal status: INITIAL  3.  Pt will demonstrate TUG of </=12 seconds in order to decrease risk of falls and improve functional mobility using LRAD. Baseline: 13.90 sec no AD CGA (10/21) Goal status: INITIAL  4.  Pt will increase BERG balance score to >/=53/56 to demonstrate improved static balance. Baseline: 43/56 (10/21) Goal status: INITIAL  5.  Pt to ambulate w/ most appropriate AD option >/=150 ft without significant LOB and no more than SBA in order to improve ambulatory safety and tolerance. Baseline: Effortful gait w/ severe bilateral toe drag and knee hyperextension w/o AD/bracing Goal status: INITIAL  6.  Most appropriate bracing/toe cap combination to be assessed and order to be requested as appropriate. Baseline: Discussed options on eval. Goal status: INITIAL  ASSESSMENT:  CLINICAL IMPRESSION: Emphasis of skilled session today on left and right discrimination with prior trialed reaction time challenges using blaze pods.  He does well with few errors made today, but is challenged by decline and compliant surfaces.  His gait appeared less spastic this date and pt feeling more confident with his stride today.  He may benefit from walker adjustment if he brings to future session as well as ongoing work to improve foot clearance and other gait mechanics to improve dynamic stability and limit  fall risk.  Will continue per POC to address impairments as listed and progress towards LTGs as written.  OBJECTIVE IMPAIRMENTS: Abnormal gait, decreased activity tolerance, decreased balance, decreased coordination, decreased endurance, decreased knowledge of condition, decreased knowledge  of use of DME, difficulty walking, impaired perceived functional ability, impaired tone, impaired UE functional use, impaired vision/preception, and improper body mechanics.   ACTIVITY LIMITATIONS: carrying, lifting, bending, standing, squatting, stairs, transfers, reach over head, and locomotion level  PARTICIPATION LIMITATIONS: meal prep, cleaning, interpersonal relationship, community activity, occupation, and yard work  PERSONAL FACTORS: Age, Past/current experiences, Time since onset of injury/illness/exacerbation, and 1-2 comorbidities: ongoing diagnostic process, prior SCI/cervical myelopathy w/ resultant spasticity/ataxia are also affecting patient's functional outcome.   REHAB POTENTIAL: Good  CLINICAL DECISION MAKING: Unstable/unpredictable  EVALUATION COMPLEXITY: High  PLAN:  PT FREQUENCY: 2x/week  PT DURATION: 8 weeks  PLANNED INTERVENTIONS: 97164- PT Re-evaluation, 97750- Physical Performance Testing, 97110-Therapeutic exercises, 97530- Therapeutic activity, [id number]- Neuromuscular re-education, 97535- Self Care, [id number]- Manual therapy, [id number]- Gait training, [phone]- Orthotic Initial, [phone]- Orthotic/Prosthetic subsequent, [phone]- Aquatic Therapy, [phone]- Electrical stimulation (manual), Patient/Family education, Balance training, Stair training, Taping, Vestibular training, and DME instructions  PLAN FOR NEXT SESSION: Expand HEP - BLE coordination, strength and balance.  Work on environmental manager - need to work on outdoors/unlevel/obstacles.  coordination - toe taps, leg press, lifting mechanics, weighted push/pull, hamstring strength;  Ankle wts for obstacle management; resisted walking, perturbations; bean bag slide w/ ankle wts Did he hear from [employer]?  Try stretching (caution due to knee hyperextension) - pt reports hamstring tightness   Daved KATHEE Bull, PT, DPT 06/03/2024, 5:30 PM

## 2024-06-03 NOTE — Therapy (Signed)
 OUTPATIENT OCCUPATIONAL THERAPY NEURO TREATMENT  Patient Name: Brandon Heath MRN: [id number] DOB:August 24, 1955, 68 y.o., male Today's Date: 06/03/2024  PCP: Clarice Nottingham, MD REFERRING PROVIDER: Kathlene Jenel Meter, MD  END OF SESSION:  OT End of Session - 06/03/24 1529     Visit Number 2    Number of Visits 16    Date for Recertification  07/20/24    Authorization Type BC/BS - Auth not required, VL: 30    OT Start Time 1530    OT Stop Time 1615    OT Time Calculation (min) 45 min    Equipment Utilized During Treatment Oval 8 splint    Activity Tolerance Patient tolerated treatment well    Behavior During Therapy [facility] for tasks assessed/performed          Past Medical History:  Diagnosis Date   Anxiety    Cervical myelopathy (HCC) 04/16/2019   Depression    Gait disorder 04/16/2019   Past Surgical History:  Procedure Laterality Date   LAMINECTOMY     cervical    TONSILLECTOMY     Patient Active Problem List   Diagnosis Date Noted   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   Hx of excision of lamina of cervical vertebra for decompression of spinal cord 02/05/2024   Colon cancer screening 01/31/2024   Difficulty walking 01/31/2024   Family history of colon cancer 01/31/2024   History of colonic polyps 01/31/2024   Cervical myelopathy (HCC) 04/16/2019   Gait disorder 04/16/2019   Tobacco use 12/21/2011   Anxiety 12/06/2011   Muscle spasm of both lower legs 12/06/2011   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   Night sweats 11/20/2011   Skin mole 11/20/2011    ONSET DATE: 04/23/2024 (referral date)  REFERRING DIAG:  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  He did start his Baclophen last Thursday and hasn't had any issues so far.    Pt reports he has had is new prism  glasses x 3 months but does have some difficulty with them and plans to reach back out to his eye doctor.  Pt reports MDs are trying to determine the type of ataxia he is having to see if it will respond to different medications.  He finds the ataxia affects his typing ie) uses his L thumb and wonders if it would be more effective to eliminate the L hand from typing to avoid typing letters multiple times by accident.    Pt accompanied by: self  PERTINENT HISTORY: Pt is actively undergoing genetic testing with [facility] in regards to diagnostic process for ongoing symptoms and follows up with [facility]. 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 Pt continues to work for [employer] - has motor deficits of hands (L worse). Has some tingling in digits 1-3 bilaterally but left is new  Cervical myelopathy s/p laminectomy, anxiety, ataxia, tobacco use, spasticity, muscle spasms   PRECAUTIONS: Fall  WEIGHT BEARING RESTRICTIONS: No  PAIN:  Are you having pain? No  FALLS: Has patient fallen in last 6 months? Yes. 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  HAND DOMINANCE: Right  ADLs: Overall ADLs: mod I  Transfers/ambulation related to ADLs: independent but difficulty with balance Eating: independent  Grooming:  independent  UB Dressing: buttons more difficulty but mod I  LB Dressing: independent Toileting: independent Bathing: mod I  Tub Shower transfers: mod I - but recommends grab bars Equipment: none  IADLs: Shopping: order online and has groceries delivered [employer]: mod I  Meal Prep: wife does the cooking, pt makes sandwiches and heat up things on stove Community mobility: independent Medication management: independent Landscape architect: independent  Handwriting: 90% legible and in print  MOBILITY STATUS: Independent   UPPER EXTREMITY ROM:  BUE AROM WNL's except difficulty controlling hand movements (graded control)   UPPER EXTREMITY MMT:   WFL's   HAND FUNCTION: Grip strength: Right: 42 lbs; Left: 63.7 lbs  COORDINATION: Finger Nose Finger test: impaired LUE w/ increased tremors 9 Hole Peg test: Right: 32.76 sec; Left: 44.47 sec  SENSATION: First 3 fingers of both hands (Rt hand more dulled from old SCI, Lt hand this year from shingles - more electric)   EDEMA: none   COGNITION: Overall cognitive status: Within functional limits for tasks assessed  VISION: Subjective report: Intermittent diplopia reported. I have prism  glasses. When I move my head, it takes a moment to focus Baseline vision: Wears glasses all the time but does remove sometimes for reading Visual history: see above  VISION ASSESSMENT: Tracking/Visual pursuits: Decreased smoothness with horizontal tracking and nystagmus Rt eye looking Rt, Lt eye when looking Lt Convergence: WFL Diplopia assessment: intermittent Saccades: impaired  PERCEPTION: Not tested  PRAXIS: Not tested  OBSERVATIONS: hyperextension in PIP joints bilaterally but worse in Lt hand long and ring finger - appears more d/t splaying of hands d/t decreased ability to control/grade opening, tremors mild (appears more at rest), coordination worse Lt side, but decreased grip strength Rt hand; abnormal visual  presentation                                                                                                                          TREATMENT DATE: 06/03/24  - Self Care education and training completed for duration as noted below including:  Pt was issued diplopia HEP per pt instruction with visual demonstration and verbal cues for proper execution with return demonstration sought.  Pt instructed to complete visual scanning with each eye individually and then to work with both eyes together, focusing on seeing 1 image and then eventually being able to track object without double vision.  Extra time and effort needed to track appropriately to each side and  then to work with both eyes together.   Other education provided today included: adaptations on computer to increase efficiency with typing by implementing options accessibility menu including setting filter keys to ignore repeated keystrokes and ensure autocorrect is selected.   - Pt also seen for orthotic fit and training with trial of Oval 8 splints to B middle fingers due to excessive hyper extension of PIP joints.  Began with trial of middle fingers to see if this helps decreased posture of other digits as well before considering splinting all of his fingers. Size 9 Oval 8 splints issued with + sign on splint place proximally on his finger for most appropriate/snug fit of the splints. Instructed pt on gradual increase in wear time and monitoring for effective positioning and comfort.     PATIENT EDUCATION: Education details: Diplopia HEP Person educated: Patient Education method: Programmer, Multimedia, Facilities Manager, Verbal cues, and Handouts Education comprehension: verbalized understanding, returned demonstration, verbal cues required, and needs further education  HOME EXERCISE PROGRAM: 06/03/24: Diplopia HEP  GOALS: Goals reviewed with patient? Yes  SHORT TERM GOALS: Target date: 06/19/24  Pt to be independent with HEP for bilateral  coordination Baseline: Goal status: IN Progress  2.  Pt to be independent with Oval 8 finger splints for digits prn to help minimize/prevent PIP hyperextension Baseline:  Goal status: IN Progress  3.  Pt to verbalize understanding of adaptations on computer and possible voice recognition software to increase efficiency with his job duties Baseline:  Goal status: IN Progress  4.  Pt to verbalize understanding with safety considerations d/t sensory loss in hands (first 3 digits bilaterally) Baseline:  Goal status: INITIAL  5.  Pt to verbalize understanding with visual strategies and possible need for community resources Baseline:  Goal status: INITIAL    LONG TERM GOALS: Target date: 07/20/24  Independent with updated HEP prn Baseline:  Goal status: INITIAL  2.  Pt to report greater ease with buttons using A/E prn Baseline:  Goal status: INITIAL  3.  Pt to verbalize understanding with DME for fall prevention while showering  Baseline:  Goal status: INITIAL  4.  Pt to improve Lt hand coordination as evidenced by reducing speed on 9 hole peg test to 40 sec or less Baseline: 44.47 sec Goal status: INITIAL  5.  Pt to improve Rt grip strength to 48 lbs or greater Baseline: Rt = 42 lbs (Lt = 63 lbs)  Goal status: INITIAL   ASSESSMENT:  CLINICAL IMPRESSION: Patient is a 68 y.o. male who was seen today for occupational therapy treatment for hereditary ataxia.  Pt education provided re diplopia HEP and re: accessing accessibility options on the computer to help with accidentally touching keys too much with LUE.  Pt will benefit from continued skilled OT services in the outpatient setting to work on impairments as noted at eval to help pt return to max functional abilities.    PERFORMANCE DEFICITS: in functional skills including ADLs, IADLs, coordination, strength, Fine motor control, Gross motor control, mobility, balance, decreased knowledge of precautions, decreased knowledge  of use of DME, and UE functional use.   IMPAIRMENTS: are limiting patient from ADLs, IADLs, work, leisure, and social participation.   CO-MORBIDITIES: may have co-morbidities  that affects occupational performance. Patient will benefit from skilled OT to address above impairments and improve overall function.  REHAB POTENTIAL: Fair unknown etiology  PLAN:  OT FREQUENCY: 1-2x/week  OT DURATION: up to 8 weeks  PLANNED INTERVENTIONS: 97535 self care/ADL training, 02889 therapeutic exercise,  97530 therapeutic activity, 97112 neuromuscular re-education, 97140 manual therapy, [id number] aquatic therapy, 97010 moist heat, 97760 Orthotic Initial, 02236 Orthotic/Prosthetic subsequent, functional mobility training, visual/perceptual remediation/compensation, coping strategies training, patient/family education, and DME and/or AE instructions  RECOMMENDED OTHER SERVICES: did recommend grab bars installed in shower. May need neuro opthamalogist  CONSULTED AND AGREED WITH PLAN OF CARE: Patient  PLAN FOR NEXT SESSION:  progress towards STG's Coordination HEP Review diplopia HEP  From eval: Marvetta - further assess vision and make recommendations prn) -but he's not on Geny's schedule He only has 1 more scheduled OT with [facility] - will need to schedule more appts   Clarita LITTIE Pride, OT 06/03/2024, 6:12 PM

## 2024-06-10 ENCOUNTER — Ambulatory Visit: Admitting: Occupational Therapy

## 2024-06-10 ENCOUNTER — Ambulatory Visit: Admitting: Physical Therapy

## 2024-06-10 ENCOUNTER — Encounter: Payer: Self-pay | Admitting: Physical Therapy

## 2024-06-10 DIAGNOSIS — R482 Apraxia: Secondary | ICD-10-CM | POA: Diagnosis not present

## 2024-06-10 DIAGNOSIS — R208 Other disturbances of skin sensation: Secondary | ICD-10-CM | POA: Insufficient documentation

## 2024-06-10 DIAGNOSIS — R2681 Unsteadiness on feet: Secondary | ICD-10-CM | POA: Insufficient documentation

## 2024-06-10 DIAGNOSIS — R29898 Other symptoms and signs involving the musculoskeletal system: Secondary | ICD-10-CM | POA: Insufficient documentation

## 2024-06-10 DIAGNOSIS — R260 Ataxic gait: Secondary | ICD-10-CM | POA: Diagnosis not present

## 2024-06-10 DIAGNOSIS — R2689 Other abnormalities of gait and mobility: Secondary | ICD-10-CM | POA: Insufficient documentation

## 2024-06-10 DIAGNOSIS — R278 Other lack of coordination: Secondary | ICD-10-CM | POA: Diagnosis not present

## 2024-06-10 NOTE — Therapy (Signed)
 OUTPATIENT PHYSICAL THERAPY NEURO TREATMENT   Patient Name: Brandon Heath MRN: [id number] DOB:1955/08/07, 68 y.o., male Today's Date: 06/10/2024   PCP: Brandon Nottingham, MD REFERRING PROVIDER: Onita Duos, MD  END OF SESSION:  PT End of Session - 06/10/24 1538     Visit Number 11    Number of Visits 17   16 + eval   Date for Recertification  07/04/24   pushed out due to scheduling delays   Authorization Type [employer]    Authorization Time Period requested 10/16    PT Start Time 1534    PT Stop Time 1623    PT Time Calculation (min) 49 min    Equipment Utilized During Treatment Gait belt    Activity Tolerance Patient tolerated treatment well    Behavior During Therapy WFL for tasks assessed/performed          Past Medical History:  Diagnosis Date   Anxiety    Cervical myelopathy (HCC) 04/16/2019   Depression    Gait disorder 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   Past Surgical History:  Procedure Laterality Date   LAMINECTOMY     cervical    TONSILLECTOMY     Patient Active Problem List   Diagnosis Date Noted   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   Hx of excision of lamina of cervical vertebra for decompression of spinal cord 02/05/2024   Colon cancer screening 01/31/2024   Difficulty walking 01/31/2024   Family history of colon cancer 01/31/2024   History of colonic polyps 01/31/2024   Cervical myelopathy (HCC) 04/16/2019   Gait disorder 04/16/2019   Tobacco use 12/21/2011   Anxiety 12/06/2011   Muscle spasm of both lower legs 12/06/2011   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   Night sweats 11/20/2011   Skin mole 11/20/2011    ONSET DATE: 6-12 months ago  REFERRING DIAG: R26.9 (ICD-10-CM) - Gait disorder Z98.890 (ICD-10-CM) - Hx of excision of lamina of cervical vertebra for decompression of spinal cord R27.0 (ICD-10-CM) - Ataxia  THERAPY DIAG:  Other lack of coordination  Other disturbances of skin sensation  Other symptoms and signs involving the musculoskeletal system  Unsteadiness on feet  Ataxic  gait  Other abnormalities of gait and mobility  Apraxia  Rationale for Evaluation and Treatment: Rehabilitation  SUBJECTIVE:                                                                                                                                                                                             SUBJECTIVE STATEMENT: Brandon Heath He has increased his dose of Baclofen  as MD recommended and is hopeful this will help but is unsure how much is subconscious.  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  He stepped onto the grassy area next to the parking space and there were a lot of acorns and his foot slipped, but he caught himself.  He reports feeling very anxious today but is unsure why as he rested well and has had no change to his routine or otherwise.  He is planning to get shoes for [employer].  He inquires about cowboy boots being beneficial.   FROM EVAL:  Pt is actively undergoing genetic testing with Brandon Heath in regards to diagnostic process for ongoing symptoms and follows up with Brandon Heath.  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  Pt continues to work for Brandon Heath - has motor deficits of hands (L worse).  Has some tingling in digits 1-3 bilaterally but left is new.  On observation he has hyperextension of bilateral fingers.  When just sitting still he feels perfectly normal.  2 years ago he was totally normal and able to move them out of his 2nd floor apt.  He reports the only lasting symptom of his prior SCI from 30-40 years ago is increased bouncing of his RLE worse than his LLE (appears controlled in sitting and returns even after pressure applied through limb).  He endorses that his current symptoms are new and have increased over the last 6-12 months with no known trigger/injury.  He reports he face planted forward with every recent fall and can feel that his drags his toes.  He shows therapist his shoes with beginnings of holes worn  into the first inch of his shoes bilaterally - noted on ambulation into clinic as well as significant bilateral genu recurvatum.  He has tried Baclofen  because I bounce when I walk.  He feels he can walk more freely with his shoulders rounded but notes they frequently stiffen as he walks pulling him backwards.  He is unsure of number of falls but knows it's more than 6 in the recent 6 months, always forward usually due to catching his toe.  When he wears dress shoes he has less toe catch in the house.  He provides paper OT referral from Brandon Heath with PT and states one of his MD feels he has more UE deficits - he is aware that finger-to-nose is very hard for him. Pt accompanied by: self (pt drives himself)  PERTINENT HISTORY: Cervical myelopathy s/p laminectomy, anxiety, ataxia, tobacco use, spasticity, muscle spasms  PAIN:  Are you having pain? No  PRECAUTIONS: Fall  RED FLAGS: None   WEIGHT BEARING RESTRICTIONS: No  FALLS: Has patient fallen in last 6 months? Yes. Number of falls >/=6; always falls forward onto hands and knees usually due to toe catch  LIVING ENVIRONMENT: Lives with: lives with their spouse - has 2 cats and small dog Lives in: House/apartment (1st floor apt) Stairs: No Has following equipment at home: Wheelchair (manual)  PLOF: Independent  PATIENT GOALS: walk to my desk or my car and not have it be an adventure  OBJECTIVE:  Note: Objective measures were completed at Evaluation unless otherwise noted.  DIAGNOSTIC FINDINGS:  Brain and cervical MRI from 8/6 IMPRESSION: Unremarkable MRI scan of the brain with and without contrast. IMPRESSION: MRI scan of the cervical spine with and without contrast showing stable changes of posterior decompression from C3-C6 with prominent disc and facet degenerative changes resulting in severe bilateral foraminal narrowing on the right at C3-4, on the left at C4-5 and bilaterally at  C7-T1.  No abnormal postcontrast enhancement is  noted.  Overall no significant change compared with previous MRI from 01/18/2021.  Thoracic MRI 8/12 IMPRESSION:  Normal MRI thoracic spine (without).  COGNITION: Overall cognitive status: Within functional limits for tasks assessed   SENSATION: Light touch: WFL  COORDINATION: LE RAMS:  mildly slower, but coordinated Heel-to-shin:  WNL bilaterally  EDEMA:  None significant in BLE  MUSCLE TONE: LLE: Modifed Ashworth Scale 1+ = Slight increase in muscle tone, manifested by a catch, followed by minimal resistance throughout the remainder (less than half) of the ROM and Bilateral clonus and RLE tone MAS same as LLE  POSTURE: forward head and posterior pelvic tilt - mild forward head  LOWER EXTREMITY ROM:     Active  Right Eval Left Eval  Hip flexion WFL bilaterally  Hip extension   Hip abduction   Hip adduction   Hip internal rotation   Hip external rotation   Knee flexion   Knee extension   Ankle dorsiflexion   Ankle plantarflexion   Ankle inversion    Ankle eversion     (Blank rows = not tested)  LOWER EXTREMITY MMT:    MMT Right Eval Left Eval  Hip flexion 4-/5 4-/5  Hip extension    Hip abduction 4/5 4/5  Hip adduction    Hip internal rotation    Hip external rotation    Knee flexion    Knee extension 5/5 5/5  Ankle dorsiflexion 4/5 4+/5  Ankle plantarflexion    Ankle inversion    Ankle eversion    (Blank rows = not tested)  BED MOBILITY:  Findings: Sit to supine Complete Independence Supine to sit Complete Independence Rolling to Right Complete Independence Rolling to Left Complete Independence  TRANSFERS: Sit to stand: SBA  Assistive device utilized: None     Stand to sit: SBA  Assistive device utilized: None     Chair to chair: SBA and CGA  Assistive device utilized: None       RAMP:  Not tested  CURB:  Not tested  STAIRS: Not tested - pt reports no trouble with stairs that he has noted GAIT: Findings: Gait Characteristics: step to  pattern, step through pattern, decreased arm swing- Right, decreased arm swing- Left, decreased step length- Right, decreased step length- Left, decreased stride length, decreased hip/knee flexion- Right, decreased hip/knee flexion- Left, decreased ankle dorsiflexion- Right, decreased ankle dorsiflexion- Left, scissoring, ataxic, decreased trunk rotation, narrow BOS, poor foot clearance- Right, and poor foot clearance- Left, Distance walked: various clinic distances, Assistive device utilized:None, Level of assistance: SBA and CGA, and Comments: repeated toe catch bilaterally - shoe assessment shoes severely worn toes of shoes  FUNCTIONAL TESTS:  5 times sit to stand: 11.75 sec int BUE support to catch uncontrolled lower - posterior bias resulting in uncontrolled lower and poorly coordinated sequencing Timed up and go (TUG): TBA Berg Balance Scale: TBA   PATIENT SURVEYS:  ABC scale: The Activities-Specific Balance Confidence (ABC) Scale 0% 10 20 30  40 50 60 70 80 90 100% No confidence<->completely confident  "How confident are you that you will not lose your balance or become unsteady when you . . .   Date tested 04/23/2024  Walk around the house 50%  2. Walk up or down stairs 100%  3. Bend over and pick up a slipper from in front of a closet floor 100%  4. Reach for a small can off a shelf at eye level 100%  5. Stand on  tip toes and reach for something above your head 100%  6. Stand on a chair and reach for something 100%  7. Sweep the floor 100%  8. Walk outside the house to a car parked in the driveway 0%  9. Get into or out of a car 50%  10. Walk across a parking lot to the mall 0%  11. Walk up or down a ramp 0%  12. Walk in a crowded mall where people rapidly walk past you 0%  13. Are bumped into by people as you walk through the mall 0%  14. Step onto or off of an escalator while you are holding onto the railing 100%  15. Step onto or off an escalator while holding onto parcels such  that you cannot hold onto the railing 50%  16. Walk outside on icy sidewalks 0%  Total: #/16 850/16 = 53.125%                                                                                                                                 TREATMENT DATE: 06/10/2024  Discussed cowboy boots and discouraged buying these due to lack of any grip and angle/height of heel. Discussed re-cert vs D/C next visit - pt desires dropping to 1x/wk so he does not have to take off time from work.  He wants to continue due to recent change with Baclofen . PT provides passive hamstring stretching bilaterally several rounds - palpable increased insertion tension of medial and lateral hamstrings L > R Pt completes 3x45 sec supine hamstring stretch w/ strap each LE; no hyperextension noted as w/ gait Long-sitting gastroc stretch w/ strap 2x45 sec each LE Bean bag slide R only x230' CGA x1 LOB w/ self recovery > L only x230' CGA-minA x3 LOB w/ severe toe catch using LUE to recover - used bil 3lb ankle wts Bean bag slide to various color targets - PT dropping bags in random spots for pt to practice stepping/turning/obstacle negotiation prior to slide > had pt squat to gather each color bag and return to basket for increased physical demand and balance w/ low COM - used bilateral 3lb ankle wts  PATIENT EDUCATION: Education details: Continue HEP 5 days per week.  See above for further. Person educated: Patient Education method: Explanation, Demonstration, Verbal cues, and Handouts Education comprehension: verbalized understanding and needs further education  HOME EXERCISE PROGRAM: Access Code: [email or web address] URL: [email or web address] Date: 04/29/2024 Prepared by: Daved Bull  Exercises - Standing March with Alternating [facility]  - 1 x daily - 4-5 x weekly - 1-2 sets - 10 reps - Corner Balance Feet Together With Eyes Closed  - 1 x daily - 4-5 x weekly - 1 sets - 2-3 reps - 30-45 seconds hold -  Standing Balance with Eyes Closed on Foam  - 1 x daily - 4-5 x weekly - 1 sets - 2-3 reps - 30-45 seconds hold - Step Sideways with  Arms Reaching  - 1 x daily - 4-5 x weekly - 1-2 sets - 10 reps - Forward Reach Using Hip Strategy Forward Backward  - 1 x daily - 4-5 x weekly - 1-2 sets - 10 reps - Forward Reach Using Hip Strategy Side to Side  - 1 x daily - 4-5 x weekly - 1-2 sets - 10 reps - Supine Hamstring Stretch with Strap  - 1 x daily - 4-5 x weekly - 1 sets - 2-3 reps - 45 seconds hold - Long Sitting Calf Stretch with Strap  - 1 x daily - 4-5 x weekly - 1 sets - 2-3 reps - 45 seconds hold  GOALS: Goals reviewed with patient? Yes  SHORT TERM GOALS: Target date: 05/23/2024  Pt will be independent and compliant with introductory HEP in order to maintain functional progress and improve mobility. Baseline:  IND and compliant (11/14) Goal status: MET  2.  Pt will maintain 5xSTS timing but demonstrate improved mechanics for safety in order to demonstrate decreased risk for falls and improved functional bilateral LE strength and power. Baseline: 11.75 seconds w/ intermittent BUE support - posterior bias; 10.00 sec no UE support - pt has less posterior bias and no LE touching into standing; 7.00 sec w/ BUE support similar mechanics to without hand support (11/14) Goal status: MET  3.  TUG to be assessed w/ STG set as appropriate. Baseline: 13.90 sec no AD CGA - LTG only (10/21) Goal status: MET   4.  Pt will increase BERG balance score to >/=48/56 to demonstrate improved static balance. Baseline: 43/56 (10/21); 51/56 (11/14) Goal status: MET  LONG TERM GOALS: Target date: 06/20/2024  Pt will be independent and compliant with advanced and finalized balance focused HEP in order to maintain functional progress and improve mobility. Baseline: To be established. Goal status: INITIAL  2.  Pt will improve ABC Scale score to >/= 63.125% in order to demonstrate decreased risk of falling and  improved confidence in high level mobility. Baseline: 53.125% Goal status: INITIAL  3.  Pt will demonstrate TUG of </=12 seconds in order to decrease risk of falls and improve functional mobility using LRAD. Baseline: 13.90 sec no AD CGA (10/21) Goal status: INITIAL  4.  Pt will increase BERG balance score to >/=53/56 to demonstrate improved static balance. Baseline: 43/56 (10/21) Goal status: INITIAL  5.  Pt to ambulate w/ most appropriate AD option >/=150 ft without significant LOB and no more than SBA in order to improve ambulatory safety and tolerance. Baseline: Effortful gait w/ severe bilateral toe drag and knee hyperextension w/o AD/bracing Goal status: INITIAL  6.  Most appropriate bracing/toe cap combination to be assessed and order to be requested as appropriate. Baseline: Discussed options on eval. Goal status: INITIAL  ASSESSMENT:  CLINICAL IMPRESSION: Focus of skilled PT initially on stretching hamstrings w/ pt demonstrating profound tightness at insertion points and able to stretch without hyperextension.  His calves are much less tight than his hamstrings so provided focus on hamstrings with addition to HEP.  Remaining time spent working on DF control and SLS with dynamic movement.  He is fatigued by task and demonstrates decreased L motor control and more ataxic movements than with RLE.  He stands to benefit from further skilled PT in this setting as he adjusts to changes in his Baclofen  and receives his toe caps.  Will assess goals and adjust POC accordingly at next scheduled visit.  OBJECTIVE IMPAIRMENTS: Abnormal gait, decreased activity tolerance, decreased balance,  decreased coordination, decreased endurance, decreased knowledge of condition, decreased knowledge of use of DME, difficulty walking, impaired perceived functional ability, impaired tone, impaired UE functional use, impaired vision/preception, and improper body mechanics.   ACTIVITY LIMITATIONS: carrying,  lifting, bending, standing, squatting, stairs, transfers, reach over head, and locomotion level  PARTICIPATION LIMITATIONS: meal prep, cleaning, interpersonal relationship, community activity, occupation, and yard work  PERSONAL FACTORS: Age, Past/current experiences, Time since onset of injury/illness/exacerbation, and 1-2 comorbidities: ongoing diagnostic process, prior SCI/cervical myelopathy w/ resultant spasticity/ataxia are also affecting patient's functional outcome.   REHAB POTENTIAL: Good  CLINICAL DECISION MAKING: Unstable/unpredictable  EVALUATION COMPLEXITY: High  PLAN:  PT FREQUENCY: 2x/week  PT DURATION: 8 weeks  PLANNED INTERVENTIONS: [id number]- PT Re-evaluation, 97750- Physical Performance Testing, 97110-Therapeutic exercises, 97530- Therapeutic activity, [id number]- Neuromuscular re-education, 97535- Self Care, [id number]- Manual therapy, [id number]- Gait training, [phone]- Orthotic Initial, [phone]- Orthotic/Prosthetic subsequent, [phone]- Aquatic Therapy, [phone]- Electrical stimulation (manual), Patient/Family education, Balance training, Stair training, Taping, Vestibular training, and DME instructions  PLAN FOR NEXT SESSION: Expand HEP - BLE coordination, strength and balance.  Work on environmental manager - need to work on outdoors/unlevel/obstacles.  coordination - toe taps, leg press, lifting mechanics, weighted push/pull, hamstring strength;  Ankle wts for obstacle management; resisted walking, perturbations; repeat bean bag slide w/ ankle wts  RECERT for 1x/wk for 8 wks  Daved KATHEE Bull, PT, DPT 06/10/2024, 5:01 PM

## 2024-06-13 ENCOUNTER — Ambulatory Visit

## 2024-06-13 ENCOUNTER — Ambulatory Visit: Admitting: Physical Therapy

## 2024-06-13 ENCOUNTER — Telehealth: Payer: Self-pay | Admitting: Physical Therapy

## 2024-06-13 NOTE — Telephone Encounter (Signed)
 Attempted to call pt regarding no show for PT/OT 12/5.  Pt did not answer and VM unidentified so did not LVM.  Pt needs to schedule 1 follow-up PT visit for re-cert if desiring continuation of POC.  Daved Bull, PT, DPT

## 2024-06-25 ENCOUNTER — Inpatient Hospital Stay: Admission: RE | Admit: 2024-06-25 | Discharge: 2024-06-25 | Attending: Nurse Practitioner

## 2024-06-25 DIAGNOSIS — R972 Elevated prostate specific antigen [PSA]: Secondary | ICD-10-CM

## 2024-06-25 MED ORDER — GADOPICLENOL 0.5 MMOL/ML IV SOLN
6.0000 mL | Freq: Once | INTRAVENOUS | Status: AC | PRN
  Administered 2024-06-25: 13:00:00 6 mL via INTRAVENOUS

## 2024-09-04 ENCOUNTER — Ambulatory Visit: Admitting: Neurology
